# Patient Record
Sex: Female | Born: 1995 | Race: White | Hispanic: No | Smoking: Never smoker
Health system: Southern US, Community
[De-identification: ages and names within clinical notes are randomized; demographics above are authoritative.]

## PROBLEM LIST (undated history)

## (undated) DIAGNOSIS — O24419 Gestational diabetes mellitus in pregnancy, unspecified control: Secondary | ICD-10-CM

## (undated) DIAGNOSIS — Z789 Other specified health status: Secondary | ICD-10-CM

## (undated) DIAGNOSIS — O139 Gestational [pregnancy-induced] hypertension without significant proteinuria, unspecified trimester: Secondary | ICD-10-CM

## (undated) DIAGNOSIS — R112 Nausea with vomiting, unspecified: Secondary | ICD-10-CM

## (undated) DIAGNOSIS — K8020 Calculus of gallbladder without cholecystitis without obstruction: Secondary | ICD-10-CM

## (undated) DIAGNOSIS — Z8632 Personal history of gestational diabetes: Secondary | ICD-10-CM

## (undated) HISTORY — DX: Gestational (pregnancy-induced) hypertension without significant proteinuria, unspecified trimester: O13.9

## (undated) HISTORY — PX: NO PAST SURGERIES: SHX2092

## (undated) HISTORY — DX: Other specified health status: Z78.9

---
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
## 2014-03-24 ENCOUNTER — Encounter (HOSPITAL_COMMUNITY): Payer: Self-pay | Admitting: Emergency Medicine

## 2014-03-24 ENCOUNTER — Emergency Department (INDEPENDENT_AMBULATORY_CARE_PROVIDER_SITE_OTHER)
Admission: EM | Admit: 2014-03-24 | Discharge: 2014-03-24 | Disposition: A | Discharge status: Home / Self Care | Payer: Self-pay | Source: Home / Self Care | Attending: Family Medicine | Admitting: Family Medicine

## 2014-03-24 ENCOUNTER — Emergency Department (INDEPENDENT_AMBULATORY_CARE_PROVIDER_SITE_OTHER): Payer: Self-pay

## 2014-03-24 DIAGNOSIS — M25512 Pain in left shoulder: Secondary | ICD-10-CM

## 2014-03-24 MED ORDER — CYCLOBENZAPRINE HCL 5 MG PO TABS: 5.0000 mg | ORAL_TABLET | Freq: Every evening | ORAL | Status: DC | PRN

## 2014-03-24 MED ORDER — IBUPROFEN 800 MG PO TABS
ORAL_TABLET | ORAL | Status: AC
  Filled 2014-03-24: qty 1

## 2014-03-24 MED ORDER — DICLOFENAC SODIUM 75 MG PO TBEC: 75.0000 mg | DELAYED_RELEASE_TABLET | Freq: Two times a day (BID) | ORAL | Status: DC | PRN

## 2014-03-24 MED ORDER — IBUPROFEN 800 MG PO TABS
800.0000 mg | ORAL_TABLET | Freq: Once | ORAL | Status: AC
  Administered 2014-03-24: 800 mg via ORAL

## 2014-03-24 NOTE — Discharge Instructions (Signed)
Thank you for coming in today. Come back or go to the emergency room if you notice new weakness new numbness problems walking or bowel or bladder problems. Follow up with Dr. Katrinka Blazing if not better  Cervical Strain and Sprain (Whiplash) with Rehab Cervical strain and sprain are injuries that commonly occur with "whiplash" injuries. Whiplash occurs when the neck is forcefully whipped backward or forward, such as during a motor vehicle accident or during contact sports. The muscles, ligaments, tendons, discs, and nerves of the neck are susceptible to injury when this occurs. RISK FACTORS Risk of having a whiplash injury increases if:  Osteoarthritis of the spine.  Situations that make head or neck accidents or trauma more likely.  High-risk sports (football, rugby, wrestling, hockey, auto racing, gymnastics, diving, contact karate, or boxing).  Poor strength and flexibility of the neck.  Previous neck injury.  Poor tackling technique.  Improperly fitted or padded equipment. SYMPTOMS   Pain or stiffness in the front or back of neck or both.  Symptoms may present immediately or up to 24 hours after injury.  Dizziness, headache, nausea, and vomiting.  Muscle spasm with soreness and stiffness in the neck.  Tenderness and swelling at the injury site. PREVENTION  Learn and use proper technique (avoid tackling with the head, spearing, and head-butting; use proper falling techniques to avoid landing on the head).  Warm up and stretch properly before activity.  Maintain physical fitness:  Strength, flexibility, and endurance.  Cardiovascular fitness.  Wear properly fitted and padded protective equipment, such as padded soft collars, for participation in contact sports. PROGNOSIS  Recovery from cervical strain and sprain injuries is dependent on the extent of the injury. These injuries are usually curable in 1 week to 3 months with appropriate treatment.  RELATED COMPLICATIONS    Temporary numbness and weakness may occur if the nerve roots are damaged, and this may persist until the nerve has completely healed.  Chronic pain due to frequent recurrence of symptoms.  Prolonged healing, especially if activity is resumed too soon (before complete recovery). TREATMENT  Treatment initially involves the use of ice and medication to help reduce pain and inflammation. It is also important to perform strengthening and stretching exercises and modify activities that worsen symptoms so the injury does not get worse. These exercises may be performed at home or with a therapist. For patients who experience severe symptoms, a soft, padded collar may be recommended to be worn around the neck.  Improving your posture may help reduce symptoms. Posture improvement includes pulling your chin and abdomen in while sitting or standing. If you are sitting, sit in a firm chair with your buttocks against the back of the chair. While sleeping, try replacing your pillow with a small towel rolled to 2 inches in diameter, or use a cervical pillow or soft cervical collar. Poor sleeping positions delay healing.  For patients with nerve root damage, which causes numbness or weakness, the use of a cervical traction apparatus may be recommended. Surgery is rarely necessary for these injuries. However, cervical strain and sprains that are present at birth (congenital) may require surgery. MEDICATION   If pain medication is necessary, nonsteroidal anti-inflammatory medications, such as aspirin and ibuprofen, or other minor pain relievers, such as acetaminophen, are often recommended.  Do not take pain medication for 7 days before surgery.  Prescription pain relievers may be given if deemed necessary by your caregiver. Use only as directed and only as much as you need. HEAT AND  COLD:   Cold treatment (icing) relieves pain and reduces inflammation. Cold treatment should be applied for 10 to 15 minutes every  2 to 3 hours for inflammation and pain and immediately after any activity that aggravates your symptoms. Use ice packs or an ice massage.  Heat treatment may be used prior to performing the stretching and strengthening activities prescribed by your caregiver, physical therapist, or athletic trainer. Use a heat pack or a warm soak. SEEK MEDICAL CARE IF:   Symptoms get worse or do not improve in 2 weeks despite treatment.  New, unexplained symptoms develop (drugs used in treatment may produce side effects). EXERCISES RANGE OF MOTION (ROM) AND STRETCHING EXERCISES - Cervical Strain and Sprain These exercises may help you when beginning to rehabilitate your injury. In order to successfully resolve your symptoms, you must improve your posture. These exercises are designed to help reduce the forward-head and rounded-shoulder posture which contributes to this condition. Your symptoms may resolve with or without further involvement from your physician, physical therapist or athletic trainer. While completing these exercises, remember:   Restoring tissue flexibility helps normal motion to return to the joints. This allows healthier, less painful movement and activity.  An effective stretch should be held for at least 20 seconds, although you may need to begin with shorter hold times for comfort.  A stretch should never be painful. You should only feel a gentle lengthening or release in the stretched tissue. STRETCH- Axial Extensors  Lie on your back on the floor. You may bend your knees for comfort. Place a rolled-up hand towel or dish towel, about 2 inches in diameter, under the part of your head that makes contact with the floor.  Gently tuck your chin, as if trying to make a "double chin," until you feel a gentle stretch at the base of your head.  Hold __________ seconds. Repeat __________ times. Complete this exercise __________ times per day.  STRETCH - Axial Extension   Stand or sit on a firm  surface. Assume a good posture: chest up, shoulders drawn back, abdominal muscles slightly tense, knees unlocked (if standing) and feet hip width apart.  Slowly retract your chin so your head slides back and your chin slightly lowers. Continue to look straight ahead.  You should feel a gentle stretch in the back of your head. Be certain not to feel an aggressive stretch since this can cause headaches later.  Hold for __________ seconds. Repeat __________ times. Complete this exercise __________ times per day. STRETCH - Cervical Side Bend   Stand or sit on a firm surface. Assume a good posture: chest up, shoulders drawn back, abdominal muscles slightly tense, knees unlocked (if standing) and feet hip width apart.  Without letting your nose or shoulders move, slowly tip your right / left ear to your shoulder until your feel a gentle stretch in the muscles on the opposite side of your neck.  Hold __________ seconds. Repeat __________ times. Complete this exercise __________ times per day. STRETCH - Cervical Rotators   Stand or sit on a firm surface. Assume a good posture: chest up, shoulders drawn back, abdominal muscles slightly tense, knees unlocked (if standing) and feet hip width apart.  Keeping your eyes level with the ground, slowly turn your head until you feel a gentle stretch along the back and opposite side of your neck.  Hold __________ seconds. Repeat __________ times. Complete this exercise __________ times per day. RANGE OF MOTION - Neck Circles   Stand or  sit on a firm surface. Assume a good posture: chest up, shoulders drawn back, abdominal muscles slightly tense, knees unlocked (if standing) and feet hip width apart.  Gently roll your head down and around from the back of one shoulder to the back of the other. The motion should never be forced or painful.  Repeat the motion 10-20 times, or until you feel the neck muscles relax and loosen. Repeat __________ times. Complete  the exercise __________ times per day. STRENGTHENING EXERCISES - Cervical Strain and Sprain These exercises may help you when beginning to rehabilitate your injury. They may resolve your symptoms with or without further involvement from your physician, physical therapist, or athletic trainer. While completing these exercises, remember:   Muscles can gain both the endurance and the strength needed for everyday activities through controlled exercises.  Complete these exercises as instructed by your physician, physical therapist, or athletic trainer. Progress the resistance and repetitions only as guided.  You may experience muscle soreness or fatigue, but the pain or discomfort you are trying to eliminate should never worsen during these exercises. If this pain does worsen, stop and make certain you are following the directions exactly. If the pain is still present after adjustments, discontinue the exercise until you can discuss the trouble with your clinician. STRENGTH - Cervical Flexors, Isometric  Face a wall, standing about 6 inches away. Place a small pillow, a ball about 6-8 inches in diameter, or a folded towel between your forehead and the wall.  Slightly tuck your chin and gently push your forehead into the soft object. Push only with mild to moderate intensity, building up tension gradually. Keep your jaw and forehead relaxed.  Hold 10 to 20 seconds. Keep your breathing relaxed.  Release the tension slowly. Relax your neck muscles completely before you start the next repetition. Repeat __________ times. Complete this exercise __________ times per day. STRENGTH- Cervical Lateral Flexors, Isometric   Stand about 6 inches away from a wall. Place a small pillow, a ball about 6-8 inches in diameter, or a folded towel between the side of your head and the wall.  Slightly tuck your chin and gently tilt your head into the soft object. Push only with mild to moderate intensity, building up  tension gradually. Keep your jaw and forehead relaxed.  Hold 10 to 20 seconds. Keep your breathing relaxed.  Release the tension slowly. Relax your neck muscles completely before you start the next repetition. Repeat __________ times. Complete this exercise __________ times per day. STRENGTH - Cervical Extensors, Isometric   Stand about 6 inches away from a wall. Place a small pillow, a ball about 6-8 inches in diameter, or a folded towel between the back of your head and the wall.  Slightly tuck your chin and gently tilt your head back into the soft object. Push only with mild to moderate intensity, building up tension gradually. Keep your jaw and forehead relaxed.  Hold 10 to 20 seconds. Keep your breathing relaxed.  Release the tension slowly. Relax your neck muscles completely before you start the next repetition. Repeat __________ times. Complete this exercise __________ times per day. POSTURE AND BODY MECHANICS CONSIDERATIONS - Cervical Strain and Sprain Keeping correct posture when sitting, standing or completing your activities will reduce the stress put on different body tissues, allowing injured tissues a chance to heal and limiting painful experiences. The following are general guidelines for improved posture. Your physician or physical therapist will provide you with any instructions specific to your  needs. While reading these guidelines, remember:  The exercises prescribed by your provider will help you have the flexibility and strength to maintain correct postures.  The correct posture provides the optimal environment for your joints to work. All of your joints have less wear and tear when properly supported by a spine with good posture. This means you will experience a healthier, less painful body.  Correct posture must be practiced with all of your activities, especially prolonged sitting and standing. Correct posture is as important when doing repetitive low-stress activities  (typing) as it is when doing a single heavy-load activity (lifting). PROLONGED STANDING WHILE SLIGHTLY LEANING FORWARD When completing a task that requires you to lean forward while standing in one place for a long time, place either foot up on a stationary 2- to 4-inch high object to help maintain the best posture. When both feet are on the ground, the low back tends to lose its slight inward curve. If this curve flattens (or becomes too large), then the back and your other joints will experience too much stress, fatigue more quickly, and can cause pain.  RESTING POSITIONS Consider which positions are most painful for you when choosing a resting position. If you have pain with flexion-based activities (sitting, bending, stooping, squatting), choose a position that allows you to rest in a less flexed posture. You would want to avoid curling into a fetal position on your side. If your pain worsens with extension-based activities (prolonged standing, working overhead), avoid resting in an extended position such as sleeping on your stomach. Most people will find more comfort when they rest with their spine in a more neutral position, neither too rounded nor too arched. Lying on a non-sagging bed on your side with a pillow between your knees, or on your back with a pillow under your knees will often provide some relief. Keep in mind, being in any one position for a prolonged period of time, no matter how correct your posture, can still lead to stiffness. WALKING Walk with an upright posture. Your ears, shoulders, and hips should all line up. OFFICE WORK When working at a desk, create an environment that supports good, upright posture. Without extra support, muscles fatigue and lead to excessive strain on joints and other tissues. CHAIR:  A chair should be able to slide under your desk when your back makes contact with the back of the chair. This allows you to work closely.  The chair's height should allow  your eyes to be level with the upper part of your monitor and your hands to be slightly lower than your elbows.  Body position:  Your feet should make contact with the floor. If this is not possible, use a foot rest.  Keep your ears over your shoulders. This will reduce stress on your neck and low back. Document Released: 01/14/2005 Document Revised: 05/31/2013 Document Reviewed: 04/28/2008 [facility] Patient Information 2015 [employer] [place]. This information is not intended to replace advice given to you by your health care provider. Make sure you discuss any questions you have with your health care provider.
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
## 2015-11-29 ENCOUNTER — Emergency Department (HOSPITAL_COMMUNITY)
Admission: EM | Admit: 2015-11-29 | Discharge: 2015-11-29 | Disposition: A | Discharge status: Home / Self Care | Payer: Managed Care, Other (non HMO) | Attending: Emergency Medicine | Admitting: Emergency Medicine

## 2015-11-29 ENCOUNTER — Encounter (HOSPITAL_COMMUNITY): Payer: Self-pay | Admitting: Emergency Medicine

## 2015-11-29 ENCOUNTER — Emergency Department (HOSPITAL_COMMUNITY): Payer: Managed Care, Other (non HMO)

## 2015-11-29 DIAGNOSIS — R1011 Right upper quadrant pain: Secondary | ICD-10-CM | POA: Diagnosis not present

## 2015-11-29 LAB — URINALYSIS, ROUTINE W REFLEX MICROSCOPIC
Bilirubin Urine: NEGATIVE
Glucose, UA: NEGATIVE mg/dL
Hgb urine dipstick: NEGATIVE
Ketones, ur: NEGATIVE mg/dL
LEUKOCYTES UA: NEGATIVE
Nitrite: NEGATIVE
PROTEIN: NEGATIVE mg/dL
Specific Gravity, Urine: 1.022 (ref 1.005–1.030)
pH: 8.5 — ABNORMAL HIGH (ref 5.0–8.0)

## 2015-11-29 LAB — CBC
HEMATOCRIT: 42 % (ref 36.0–46.0)
HEMOGLOBIN: 14.4 g/dL (ref 12.0–15.0)
MCH: 30.7 pg (ref 26.0–34.0)
MCHC: 34.3 g/dL (ref 30.0–36.0)
MCV: 89.6 fL (ref 78.0–100.0)
PLATELETS: 356 10*3/uL (ref 150–400)
RBC: 4.69 MIL/uL (ref 3.87–5.11)
RDW: 11.9 % (ref 11.5–15.5)
WBC: 10 10*3/uL (ref 4.0–10.5)

## 2015-11-29 LAB — COMPREHENSIVE METABOLIC PANEL
ALBUMIN: 4.9 g/dL (ref 3.5–5.0)
ALT: 16 U/L (ref 14–54)
ANION GAP: 8 (ref 5–15)
AST: 17 U/L (ref 15–41)
Alkaline Phosphatase: 114 U/L (ref 38–126)
BUN: 9 mg/dL (ref 6–20)
CO2: 28 mmol/L (ref 22–32)
Calcium: 10.5 mg/dL — ABNORMAL HIGH (ref 8.9–10.3)
Chloride: 102 mmol/L (ref 101–111)
Creatinine, Ser: 0.65 mg/dL (ref 0.44–1.00)
GFR calc Af Amer: >60 mL/min (ref >60)
GFR calc non Af Amer: >60 mL/min (ref >60)
GLUCOSE: 99 mg/dL (ref 65–99)
POTASSIUM: 3.7 mmol/L (ref 3.5–5.1)
SODIUM: 138 mmol/L (ref 135–145)
TOTAL PROTEIN: 8.5 g/dL — AB (ref 6.5–8.1)
Total Bilirubin: 0.7 mg/dL (ref 0.3–1.2)

## 2015-11-29 LAB — LIPASE, BLOOD: Lipase: 19 U/L (ref 11–51)

## 2015-11-29 LAB — PREGNANCY, URINE: PREG TEST UR: NEGATIVE

## 2015-11-29 NOTE — ED Triage Notes (Signed)
Per pt, states RUQ pain for 2 days-states nausea no vomiting-was seen by PCP and sent here for further eval

## 2015-11-29 NOTE — Discharge Instructions (Signed)
There does not appear to be an emergent cause for your symptoms at this time. Your labs and ultrasound were very reassuring. Please follow-up with your doctor for reevaluation. Return to ED for new or worsening symptoms as we discussed.

## 2015-11-29 NOTE — ED Provider Notes (Signed)
[facility]-EMERGENCY DEPT Provider Note   CSN: [id number] Arrival date & time: 11/29/15  1627     History   Chief Complaint Chief Complaint  Patient presents with  . Abdominal Pain    HPI Wanda Craig is a 20 y.o. female.  HPI here for evaluation of sudden onset epigastric and right upper quadrant sharp pain. Patient reports symptoms started yesterday at 11:30 AM. Not relieved by Tums. No dietary component. Pain will radiate through to her back when she breathes deeply. She reports associated nausea but no vomiting, urinary symptoms. Last menstrual cycle 2 weeks ago and normal for her. No other unusual vaginal bleeding or discharge. [facility]  History reviewed. No pertinent past medical history.  There are no active problems to display for this patient.   History reviewed. No pertinent surgical history.  OB History    No data available       Home Medications    Prior to Admission medications   Not on File    Family History No family history on file.  Social History Social History  Substance Use Topics  . Smoking status: Never Smoker  . Smokeless tobacco: Not on file  . Alcohol use No     Allergies   Other   Review of Systems Review of Systems A 10 point review of systems was completed and was negative except for pertinent positives and negatives as mentioned in the history of present illness    Physical Exam Updated Vital Signs BP 141/71   Pulse 95   Temp 98.3 F (36.8 C) (Oral)   Resp 18   Wt 79.9 kg   LMP 11/15/2015   SpO2 100%   Physical Exam  Constitutional: She appears well-developed. No distress.  Awake, alert and nontoxic in appearance  HENT:  Head: Normocephalic and atraumatic.  Right Ear: External ear normal.  Left Ear: External ear normal.  Mouth/Throat: Oropharynx is clear and moist.  Eyes: Conjunctivae and EOM are normal. Pupils are equal, round, and reactive to light.  Neck: Normal range of  motion. No JVD present.  Cardiovascular: Normal rate, regular rhythm and normal heart sounds.   Pulmonary/Chest: Effort normal and breath sounds normal. No stridor.  Abdominal: Soft.  Discomfort is replicated with palpation in epigastrium and right upper quadrant, worse in right upper quadrant region. Abdomen is otherwise soft, nondistended, no rebound or guarding. No right lower quadrant pain. No rash or other skin abnormality  Musculoskeletal: Normal range of motion.  Neurological:  Awake, alert, cooperative and aware of situation; motor strength and sensation appear baseline no facial asymmetry; tongue midline; major cranial nerves appear intact;  baseline gait without new ataxia.  Skin: No rash noted. She is not diaphoretic.  Psychiatric: She has a normal mood and affect. Her behavior is normal. Thought content normal.  Nursing note and vitals reviewed.    ED Treatments / Results  Labs (all labs ordered are listed, but only abnormal results are displayed) Labs Reviewed  COMPREHENSIVE METABOLIC PANEL - Abnormal; Notable for the following:       Result Value   Calcium 10.5 (*)    Total Protein 8.5 (*)    All other components within normal limits  URINALYSIS, ROUTINE W REFLEX MICROSCOPIC (NOT [facility]) - Abnormal; Notable for the following:    APPearance CLOUDY (*)    pH 8.5 (*)    All other components within normal limits  LIPASE, BLOOD  CBC  PREGNANCY, URINE  EKG  EKG Interpretation None       Radiology [place] Abdomen Complete  Result Date: 11/29/2015 CLINICAL DATA:  Acute onset of right upper quadrant abdominal pain. Initial encounter. EXAM: ABDOMEN ULTRASOUND COMPLETE COMPARISON:  None. FINDINGS: Gallbladder: No gallstones or wall thickening visualized. No sonographic Murphy sign noted by sonographer. Common bile duct: Diameter: 0.2 cm, within normal limits in caliber. Liver: No focal lesion identified. Within normal limits in parenchymal echogenicity. IVC: No abnormality  visualized. Pancreas: Visualized portion unremarkable. Spleen: Size and appearance within normal limits. Right Kidney: Length: 10.3 cm. Echogenicity within normal limits. No mass or hydronephrosis visualized. Left Kidney: Length: 10.7 cm. Echogenicity within normal limits. No mass or hydronephrosis visualized. Abdominal aorta: No aneurysm visualized. The common iliac arteries are not well characterized due to overlying structures. Other findings: None. IMPRESSION: Unremarkable abdominal ultrasound. Electronically Signed   By: Roanna RaiderJeffery  Chang M.D.   On: 11/29/2015 19:18    Procedures Procedures (including critical care time)  Medications Ordered in ED Medications - No data to display   Initial Impression / Assessment and Plan / ED Course  I have reviewed the triage vital signs and the nursing notes.  Pertinent labs & imaging results that were available during my care of the patient were reviewed by me and considered in my medical decision making (see chart for details).  Clinical Course    Patient sent by urgent care facility to ED for valuation right upper quadrant pain. She does have Mild to moderate tenderness in her right upper quadrant on exam. She is otherwise afebrile, hemodynamically stable. We will obtain screening labs, right upper quadrant ultrasound. Labs are reassuring abdominal ultrasound is unremarkable. She overall appears very well, hemodynamically stable. Discussed follow-up with PCP. Strict return precautions discussed. Patient and mother bedside verbalize understanding and agreement with plan and subsequent discharge.  Final Clinical Impressions(s) / ED Diagnoses   Final diagnoses:  RUQ pain    New Prescriptions New Prescriptions   No medications on file     Joycie PeekBenjamin Micco Bourbeau, PA-C 11/29/15 2001    Rolland PorterMark James, MD 12/09/15 [phone]

## 2015-11-29 NOTE — ED Notes (Signed)
Coming from PCP for further evaluation r/t RUQ pain-

## 2015-11-29 NOTE — ED Notes (Signed)
Pt is aware the urine specimen is needed.

## 2015-11-29 NOTE — Progress Notes (Signed)
Patient listed as not having a pcp or insurance.  EDCM spoke to patient at bedside.  Patient reports she has insurance, no pcp.  Patient has not been registered yet.  Patient is agreeable to receive a list of providers who accept her insurance to assist in establishing care.

## 2017-01-11 ENCOUNTER — Encounter (HOSPITAL_COMMUNITY): Payer: Self-pay | Admitting: Emergency Medicine

## 2017-01-11 ENCOUNTER — Ambulatory Visit (INDEPENDENT_AMBULATORY_CARE_PROVIDER_SITE_OTHER): Payer: 59

## 2017-01-11 ENCOUNTER — Ambulatory Visit (HOSPITAL_COMMUNITY)
Admission: EM | Admit: 2017-01-11 | Discharge: 2017-01-11 | Disposition: A | Discharge status: Home / Self Care | Payer: 59 | Attending: Family Medicine | Admitting: Family Medicine

## 2017-01-11 DIAGNOSIS — S8002XA Contusion of left knee, initial encounter: Secondary | ICD-10-CM | POA: Diagnosis not present

## 2017-01-11 DIAGNOSIS — M25562 Pain in left knee: Secondary | ICD-10-CM

## 2017-01-11 DIAGNOSIS — W19XXXA Unspecified fall, initial encounter: Secondary | ICD-10-CM

## 2017-01-11 NOTE — Discharge Instructions (Signed)
You may use over the counter ibuprofen or acetaminophen as needed.  ° °

## 2017-01-11 NOTE — ED Triage Notes (Signed)
PT C/O: reports she slipped on wet floor at a restaurant last night and inj her left knee  ONSET: last night  SX ALSO INCLUDE: pain increases w/activity   DENIES: head /LOC  TAKING MEDS: Ibup  A&O x4... NAD... Ambulatory

## 2017-01-13 NOTE — ED Provider Notes (Signed)
  [facility]   [id number] 01/11/17 Arrival Time: 1836  ASSESSMENT & PLAN:  1. Contusion of left knee, initial encounter    Prefers OTC analgesics. ICE. No abnormalities seen on imaging this evening. May f/u here if not seeing improvement over the next week.  Reviewed expectations re: course of current medical issues. Questions answered. Outlined signs and symptoms indicating need for more acute intervention. Patient verbalized understanding. After Visit Summary given.   SUBJECTIVE:  Wanda Craig is a 21 y.o. female who reports pain of her L knee after falling at a restaurant last evening. Reports her L knee made contact with the floor producing immediate discomfort. Able to bear weight since but bending knee increases discomfort. Describes as an "ache" with sharp exacerbations; poorly localized. No extremity sensation changes or weakness. Ibuprofen helps. No history of knee injury/pain reported. Knee not locking up or giving out on her.  ROS: As per HPI.   OBJECTIVE: BP 114/60 (BP Location: Left Arm)   Pulse 70   Temp 98.7 F (37.1 C) (Oral)   Resp 18   LMP 12/22/2016   SpO2 100%   General appearance: alert; no distress Extremities: no cyanosis or edema; symmetrical with no gross deformities; poorly localized tenderness over her left knee with no swelling and no bruising; FROM with discomfort; no crepitus CV: normal extremity capillary refill Skin: warm and dry Neurologic: normal gait but favors LLE; normal symmetric reflexes in all extremities; normal sensation Psychological: alert and cooperative; normal mood and affect  Imaging: Dg Knee Complete 4 Views Left  Result Date: 01/11/2017 CLINICAL DATA:  Left knee pain after fall last night. EXAM: LEFT KNEE - COMPLETE 4+ VIEW COMPARISON:  None. FINDINGS: No evidence of fracture, dislocation, or joint effusion. No evidence of arthropathy or other focal bone abnormality. Soft tissues are unremarkable. IMPRESSION:  Negative radiographs of the left knee. Electronically Signed   By: Rubye OaksMelanie  Ehinger M.D.   On: 01/11/2017 19:42    Allergies  Allergen Reactions  . Other Other (See Comments)    Pt requests no narcotic orders.  States she has no substance issues; she states she prefers to be cognizant of whether or not she is getting better and does not want her pain to be "muted."      Social History   Socioeconomic History  . Marital status: Single    Spouse name: Not on file  . Number of children: Not on file  . Years of education: Not on file  . Highest education level: Not on file  Social Needs  . Financial resource strain: Not on file  . Food insecurity - worry: Not on file  . Food insecurity - inability: Not on file  . Transportation needs - medical: Not on file  . Transportation needs - non-medical: Not on file  Occupational History  . Not on file  Tobacco Use  . Smoking status: Never Smoker  . Smokeless tobacco: Never Used  Substance and Sexual Activity  . Alcohol use: No  . Drug use: No  . Sexual activity: Yes  Other Topics Concern  . Not on file  Social History Narrative  . Not on file      Mardella LaymanHagler, Coran Dipaola, MD 01/13/17 1440

## 2018-07-06 ENCOUNTER — Ambulatory Visit (HOSPITAL_COMMUNITY): Payer: 59

## 2018-07-06 ENCOUNTER — Ambulatory Visit (HOSPITAL_COMMUNITY)
Admission: RE | Admit: 2018-07-06 | Discharge: 2018-07-06 | Disposition: A | Discharge status: Home / Self Care | Payer: 59 | Source: Ambulatory Visit | Attending: Emergency Medicine | Admitting: Emergency Medicine

## 2018-07-06 ENCOUNTER — Other Ambulatory Visit: Payer: Self-pay | Admitting: Emergency Medicine

## 2018-07-06 ENCOUNTER — Other Ambulatory Visit (HOSPITAL_COMMUNITY): Payer: Self-pay | Admitting: Emergency Medicine

## 2018-07-06 ENCOUNTER — Other Ambulatory Visit: Payer: Self-pay

## 2018-07-06 DIAGNOSIS — J3489 Other specified disorders of nose and nasal sinuses: Secondary | ICD-10-CM | POA: Diagnosis present

## 2018-07-06 DIAGNOSIS — R413 Other amnesia: Secondary | ICD-10-CM | POA: Insufficient documentation

## 2018-07-06 DIAGNOSIS — S0083XA Contusion of other part of head, initial encounter: Secondary | ICD-10-CM | POA: Insufficient documentation

## 2018-07-06 MED ORDER — IOHEXOL 300 MG/ML  SOLN
100.0000 mL | Freq: Once | INTRAMUSCULAR | Status: AC | PRN
  Administered 2018-07-06: 100 mL via INTRAVENOUS

## 2020-08-29 ENCOUNTER — Encounter: Payer: Self-pay | Admitting: Adult Health

## 2020-08-29 ENCOUNTER — Other Ambulatory Visit: Payer: Self-pay

## 2020-08-29 ENCOUNTER — Ambulatory Visit (INDEPENDENT_AMBULATORY_CARE_PROVIDER_SITE_OTHER): Payer: Medicaid Other | Admitting: Adult Health

## 2020-08-29 VITALS — BP 122/75 | HR 73 | Ht 63.0 in | Wt 204.0 lb

## 2020-08-29 DIAGNOSIS — Z3201 Encounter for pregnancy test, result positive: Secondary | ICD-10-CM

## 2020-08-29 DIAGNOSIS — O3680X0 Pregnancy with inconclusive fetal viability, not applicable or unspecified: Secondary | ICD-10-CM

## 2020-08-29 DIAGNOSIS — Z3A01 Less than 8 weeks gestation of pregnancy: Secondary | ICD-10-CM | POA: Diagnosis not present

## 2020-08-29 LAB — POCT URINE PREGNANCY: Preg Test, Ur: POSITIVE — AB

## 2020-08-29 NOTE — Patient Instructions (Signed)
Thank you for choosing our office today! We appreciate the opportunity to meet your healthcare needs. You may receive a short survey by e-mail or through MyChart. If you are happy with your care we would appreciate if you could take just a few minutes to complete the survey questions. We read all of your comments and take your feedback very seriously. Thank you again for choosing our office.  Noralyn Karim   [facility]  

## 2020-08-29 NOTE — Progress Notes (Signed)
  Subjective:     Patient ID: Wanda Craig, female   DOB: 05-23-1995, 25 y.o.   MRN: [id number]  HPI Oliwia is a 25 year old white female,single, in for UPT, has missed periods, was seen at [facility] has IUP,taking OTC PNV. She is a Producer, television/film/video.  Review of Systems +missed periods Maybe some nausea Reviewed past medical,surgical, social and family history. Reviewed medications and allergies.     Objective:   Physical Exam BP 122/75 (BP Location: Right Arm, Patient Position: Sitting, Cuff Size: Normal)   Pulse 73   Ht 5\' 3"  (1.6 m)   Wt 204 lb (92.5 kg)   LMP  (LMP Unknown)   BMI 36.14 kg/m  UPT + Skin warm and dry. Neck: mid line trachea, normal thyroid, good ROM, no lymphadenopathy noted. Lungs: clear to ausculation bilaterally. Cardiovascular: regular rate and rhythm. Abdomen is soft and non tender. AA is 0  Fall risk is low Depression screen PHQ 2/9 08/29/2020  Decreased Interest 0  Down, Depressed, Hopeless 0  PHQ - 2 Score 0        [facility] - 08/29/20 1510       Pregnancy Intention Screening   Does the patient want to become pregnant in the next year? Yes    Does the patient's partner want to become pregnant in the next year? Yes    Would the patient like to discuss contraceptive options today? No      Contraception Wrap Up   Current Method Pregnant/Seeking Pregnancy    End Method Pregnant/Seeking Pregnancy    Contraception Counseling Provided No             Assessment:     1. Pregnancy test positive Continue PNV  2. Less than [redacted] weeks gestation of pregnancy Check QHCG to determine weeks of gestation   3. Encounter to determine fetal viability of pregnancy, single or unspecified fetus Return in about 3 weeks for dating 10/29/20    Plan:     Review handout on first trimester and by [facility]

## 2020-08-30 LAB — BETA HCG QUANT (REF LAB): hCG Quant: 59311 m[IU]/mL
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
## 2020-09-15 ENCOUNTER — Other Ambulatory Visit: Payer: Self-pay | Admitting: Women's Health

## 2020-09-15 MED ORDER — PROMETHAZINE HCL 25 MG PO TABS: 12.5000 mg | ORAL_TABLET | Freq: Four times a day (QID) | ORAL | 0 refills | Status: DC | PRN

## 2020-09-20 ENCOUNTER — Ambulatory Visit (INDEPENDENT_AMBULATORY_CARE_PROVIDER_SITE_OTHER): Payer: Medicaid Other

## 2020-09-20 ENCOUNTER — Other Ambulatory Visit: Payer: Self-pay

## 2020-09-20 DIAGNOSIS — Z3A10 10 weeks gestation of pregnancy: Secondary | ICD-10-CM | POA: Diagnosis not present

## 2020-09-20 DIAGNOSIS — O3680X0 Pregnancy with inconclusive fetal viability, not applicable or unspecified: Secondary | ICD-10-CM

## 2020-09-20 NOTE — Progress Notes (Signed)
[place] 10+3 wks,single IUP,CRL 35.31 mm,fhr 169 bpm,normal ovaries

## 2020-10-03 ENCOUNTER — Other Ambulatory Visit: Payer: Self-pay | Admitting: Obstetrics & Gynecology

## 2020-10-03 DIAGNOSIS — O0990 Supervision of high risk pregnancy, unspecified, unspecified trimester: Secondary | ICD-10-CM | POA: Insufficient documentation

## 2020-10-03 DIAGNOSIS — Z3682 Encounter for antenatal screening for nuchal translucency: Secondary | ICD-10-CM

## 2020-10-03 DIAGNOSIS — Z3400 Encounter for supervision of normal first pregnancy, unspecified trimester: Secondary | ICD-10-CM | POA: Insufficient documentation

## 2020-10-04 ENCOUNTER — Encounter: Payer: Self-pay | Admitting: Advanced Practice Midwife

## 2020-10-04 ENCOUNTER — Other Ambulatory Visit: Payer: Self-pay

## 2020-10-04 ENCOUNTER — Ambulatory Visit (INDEPENDENT_AMBULATORY_CARE_PROVIDER_SITE_OTHER): Payer: Medicaid Other

## 2020-10-04 ENCOUNTER — Ambulatory Visit: Payer: Medicaid Other | Admitting: *Deleted

## 2020-10-04 ENCOUNTER — Ambulatory Visit (INDEPENDENT_AMBULATORY_CARE_PROVIDER_SITE_OTHER): Payer: Medicaid Other | Admitting: Advanced Practice Midwife

## 2020-10-04 VITALS — BP 125/75 | HR 91 | Wt 206.0 lb

## 2020-10-04 DIAGNOSIS — Z124 Encounter for screening for malignant neoplasm of cervix: Secondary | ICD-10-CM

## 2020-10-04 DIAGNOSIS — Z113 Encounter for screening for infections with a predominantly sexual mode of transmission: Secondary | ICD-10-CM

## 2020-10-04 DIAGNOSIS — Z3682 Encounter for antenatal screening for nuchal translucency: Secondary | ICD-10-CM

## 2020-10-04 DIAGNOSIS — Z3401 Encounter for supervision of normal first pregnancy, first trimester: Secondary | ICD-10-CM | POA: Diagnosis not present

## 2020-10-04 DIAGNOSIS — Z3A12 12 weeks gestation of pregnancy: Secondary | ICD-10-CM

## 2020-10-04 DIAGNOSIS — Z3402 Encounter for supervision of normal first pregnancy, second trimester: Secondary | ICD-10-CM

## 2020-10-04 LAB — POCT URINALYSIS DIPSTICK OB
Blood, UA: NEGATIVE
Glucose, UA: NEGATIVE
Leukocytes, UA: NEGATIVE
Nitrite, UA: NEGATIVE
POC,PROTEIN,UA: NEGATIVE

## 2020-10-04 MED ORDER — ASPIRIN 81 MG PO CHEW: 162.0000 mg | CHEWABLE_TABLET | Freq: Every day | ORAL | 7 refills | Status: DC

## 2020-10-04 MED ORDER — BLOOD PRESSURE MONITOR MISC: 0 refills | Status: DC

## 2020-10-04 NOTE — Patient Instructions (Signed)
Elsey, thank you for choosing our office today! We appreciate the opportunity to meet your healthcare needs. You may receive a short survey by mail, e-mail, or through MyChart. If you are happy with your care we would appreciate if you could take just a few minutes to complete the survey questions. We read all of your comments and take your feedback very seriously. Thank you again for choosing our office.  [facility]  [facility] ([address] [place], [place] [address]) Entrance C, located off of [address] Free 24/7 valet parking   Nausea & Vomiting Have saltine crackers or pretzels by your bed and eat a few bites before you raise your head out of bed in the morning Eat small frequent meals throughout the day instead of large meals Drink plenty of fluids throughout the day to stay hydrated, just don't drink a lot of fluids with your meals.  This can make your stomach fill up faster making you feel sick Do not brush your teeth right after you eat Products with real ginger are good for nausea, like ginger ale and ginger hard candy Make sure it says made with real ginger! Sucking on sour candy like lemon heads is also good for nausea If your prenatal vitamins make you nauseated, take them at night so you will sleep through the nausea Sea Bands If you feel like you need medicine for the nausea & vomiting please let us know If you are unable to keep any fluids or food down please let us know   Constipation Drink plenty of fluid, preferably water, throughout the day Eat foods high in fiber such as fruits, vegetables, and grains Exercise, such as walking, is a good way to keep your bowels regular Drink warm fluids, especially warm prune juice, or decaf coffee Eat a 1/2 cup of real oatmeal (not instant), 1/2 cup applesauce, and 1/2-1 cup warm prune juice every day If needed, you may take Colace (docusate sodium) stool softener  once or twice a day to help keep the stool soft.  If you still are having problems with constipation, you may take Miralax once daily as needed to help keep your bowels regular.   Home Blood Pressure Monitoring for Patients   Your provider has recommended that you check your blood pressure (BP) at least once a week at home. If you do not have a blood pressure cuff at home, one will be provided for you. Contact your provider if you have not received your monitor within 1 week.   Helpful Tips for Accurate Home Blood Pressure Checks  Don't smoke, exercise, or drink caffeine 30 minutes before checking your BP Use the restroom before checking your BP (a full bladder can raise your pressure) Relax in a comfortable upright chair Feet on the ground Left arm resting comfortably on a flat surface at the level of your heart Legs uncrossed Back supported Sit quietly and don't talk Place the cuff on your bare arm Adjust snuggly, so that only two fingertips can fit between your skin and the top of the cuff Check 2 readings separated by at least one minute Keep a log of your BP readings For a visual, please reference this diagram: http://ccnc.care/bpdiagram  Provider Name: [facility]     Phone: [phone]  Zone 1: ALL CLEAR  Continue to monitor your symptoms:  BP reading is less than 140 (top number) or less than 90 (bottom   number)  No right upper stomach pain No headaches or seeing spots No feeling nauseated or throwing up No swelling in face and hands  Zone 2: CAUTION Call your doctor's office for any of the following:  BP reading is greater than 140 (top number) or greater than 90 (bottom number)  Stomach pain under your ribs in the middle or right side Headaches or seeing spots Feeling nauseated or throwing up Swelling in face and hands  Zone 3: EMERGENCY  Seek immediate medical care if you have any of the following:  BP reading is greater than160 (top number) or greater than  110 (bottom number) Severe headaches not improving with Tylenol Serious difficulty catching your breath Any worsening symptoms from Zone 2    First Trimester of Pregnancy The first trimester of pregnancy is from week 1 until the end of week 12 (months 1 through 3). A week after a sperm fertilizes an egg, the egg will implant on the wall of the uterus. This embryo will begin to develop into a baby. Genes from you and your partner are forming the baby. The female genes determine whether the baby is a boy or a girl. At 6-8 weeks, the eyes and face are formed, and the heartbeat can be seen on ultrasound. At the end of 12 weeks, all the baby's organs are formed.  Now that you are pregnant, you will want to do everything you can to have a healthy baby. Two of the most important things are to get good prenatal care and to follow your health care provider's instructions. Prenatal care is all the medical care you receive before the baby's birth. This care will help prevent, find, and treat any problems during the pregnancy and childbirth. BODY CHANGES Your body goes through many changes during pregnancy. The changes vary from woman to woman.  You may gain or lose a couple of pounds at first. You may feel sick to your stomach (nauseous) and throw up (vomit). If the vomiting is uncontrollable, call your health care provider. You may tire easily. You may develop headaches that can be relieved by medicines approved by your health care provider. You may urinate more often. Painful urination may mean you have a bladder infection. You may develop heartburn as a result of your pregnancy. You may develop constipation because certain hormones are causing the muscles that push waste through your intestines to slow down. You may develop hemorrhoids or swollen, bulging veins (varicose veins). Your breasts may begin to grow larger and become tender. Your nipples may stick out more, and the tissue that surrounds them  (areola) may become darker. Your gums may bleed and may be sensitive to brushing and flossing. Dark spots or blotches (chloasma, mask of pregnancy) may develop on your face. This will likely fade after the baby is born. Your menstrual periods will stop. You may have a loss of appetite. You may develop cravings for certain kinds of food. You may have changes in your emotions from day to day, such as being excited to be pregnant or being concerned that something may go wrong with the pregnancy and baby. You may have more vivid and strange dreams. You may have changes in your hair. These can include thickening of your hair, rapid growth, and changes in texture. Some women also have hair loss during or after pregnancy, or hair that feels dry or thin. Your hair will most likely return to normal after your baby is born. WHAT TO EXPECT AT YOUR PRENATAL   VISITS During a routine prenatal visit: You will be weighed to make sure you and the baby are growing normally. Your blood pressure will be taken. Your abdomen will be measured to track your baby's growth. The fetal heartbeat will be listened to starting around week 10 or 12 of your pregnancy. Test results from any previous visits will be discussed. Your health care provider may ask you: How you are feeling. If you are feeling the baby move. If you have had any abnormal symptoms, such as leaking fluid, bleeding, severe headaches, or abdominal cramping. If you have any questions. Other tests that may be performed during your first trimester include: Blood tests to find your blood type and to check for the presence of any previous infections. They will also be used to check for low iron levels (anemia) and Rh antibodies. Later in the pregnancy, blood tests for diabetes will be done along with other tests if problems develop. Urine tests to check for infections, diabetes, or protein in the urine. An ultrasound to confirm the proper growth and development  of the baby. An amniocentesis to check for possible genetic problems. Fetal screens for spina bifida and Down syndrome. You may need other tests to make sure you and the baby are doing well. HOME CARE INSTRUCTIONS  Medicines Follow your health care provider's instructions regarding medicine use. Specific medicines may be either safe or unsafe to take during pregnancy. Take your prenatal vitamins as directed. If you develop constipation, try taking a stool softener if your health care provider approves. Diet Eat regular, well-balanced meals. Choose a variety of foods, such as meat or vegetable-based protein, fish, milk and low-fat dairy products, vegetables, fruits, and whole grain breads and cereals. Your health care provider will help you determine the amount of weight gain that is right for you. Avoid raw meat and uncooked cheese. These carry germs that can cause birth defects in the baby. Eating four or five small meals rather than three large meals a day may help relieve nausea and vomiting. If you start to feel nauseous, eating a few soda crackers can be helpful. Drinking liquids between meals instead of during meals also seems to help nausea and vomiting. If you develop constipation, eat more high-fiber foods, such as fresh vegetables or fruit and whole grains. Drink enough fluids to keep your urine clear or pale yellow. Activity and Exercise Exercise only as directed by your health care provider. Exercising will help you: Control your weight. Stay in shape. Be prepared for labor and delivery. Experiencing pain or cramping in the lower abdomen or low back is a good sign that you should stop exercising. Check with your health care provider before continuing normal exercises. Try to avoid standing for long periods of time. Move your legs often if you must stand in one place for a long time. Avoid heavy lifting. Wear low-heeled shoes, and practice good posture. You may continue to have sex  unless your health care provider directs you otherwise. Relief of Pain or Discomfort Wear a good support bra for breast tenderness.   Take warm sitz baths to soothe any pain or discomfort caused by hemorrhoids. Use hemorrhoid cream if your health care provider approves.   Rest with your legs elevated if you have leg cramps or low back pain. If you develop varicose veins in your legs, wear support hose. Elevate your feet for 15 minutes, 3-4 times a day. Limit salt in your diet. Prenatal Care Schedule your prenatal visits by the   twelfth week of pregnancy. They are usually scheduled monthly at first, then more often in the last 2 months before delivery. Write down your questions. Take them to your prenatal visits. Keep all your prenatal visits as directed by your health care provider. Safety Wear your seat belt at all times when driving. Make a list of emergency phone numbers, including numbers for family, friends, the hospital, and police and fire departments. General Tips Ask your health care provider for a referral to a local prenatal education class. Begin classes no later than at the beginning of month 6 of your pregnancy. Ask for help if you have counseling or nutritional needs during pregnancy. Your health care provider can offer advice or refer you to specialists for help with various needs. Do not use hot tubs, steam rooms, or saunas. Do not douche or use tampons or scented sanitary pads. Do not cross your legs for long periods of time. Avoid cat litter boxes and soil used by cats. These carry germs that can cause birth defects in the baby and possibly loss of the fetus by miscarriage or stillbirth. Avoid all smoking, herbs, alcohol, and medicines not prescribed by your health care provider. Chemicals in these affect the formation and growth of the baby. Schedule a dentist appointment. At home, brush your teeth with a soft toothbrush and be gentle when you floss. SEEK MEDICAL CARE IF:   You have dizziness. You have mild pelvic cramps, pelvic pressure, or nagging pain in the abdominal area. You have persistent nausea, vomiting, or diarrhea. You have a bad smelling vaginal discharge. You have pain with urination. You notice increased swelling in your face, hands, legs, or ankles. SEEK IMMEDIATE MEDICAL CARE IF:  You have a fever. You are leaking fluid from your vagina. You have spotting or bleeding from your vagina. You have severe abdominal cramping or pain. You have rapid weight gain or loss. You vomit blood or material that looks like coffee grounds. You are exposed to German measles and have never had them. You are exposed to fifth disease or chickenpox. You develop a severe headache. You have shortness of breath. You have any kind of trauma, such as from a fall or a car accident. Document Released: 01/08/2001 Document Revised: 05/31/2013 Document Reviewed: 11/24/2012 [employer] Patient Information 2015 [employer]. This information is not intended to replace advice given to you by your health care provider. Make sure you discuss any questions you have with your health care provider.  

## 2020-10-04 NOTE — Progress Notes (Signed)
[place] 12+3 wks,measurements c/w dates,crl 57.77 mm,NB present,NT 1.2 mm,fhr 157 bpm,anterior placenta,normal ovaries,

## 2020-10-04 NOTE — Progress Notes (Addendum)
INITIAL OBSTETRICAL VISIT Patient name: Wanda Craig MRN [id number]  Date of birth: Jan 16, 1996 Chief Complaint:   Initial Prenatal Visit (nausea)  History of Present Illness:   DELITHA Craig is a 25 y.o. G1P0 Caucasian female at [redacted]w[redacted]d by [place] at 10.3 weeks with an Estimated Date of Delivery: 04/15/21 being seen today for her initial obstetrical visit.   No LMP recorded (lmp unknown). Patient is pregnant. Her obstetrical history is significant for primigravida.   Today she reports nausea.  Last pap July 23, 2017. Results were: NILM w/ HRHPV not done  Depression screen [facility] 2/9 10/04/2020 08/29/2020  Decreased Interest 0 0  Down, Depressed, Hopeless 0 0  PHQ - 2 Score 0 0  Altered sleeping 0 -  Tired, decreased energy 1 -  Change in appetite 0 -  Feeling bad or failure about yourself  0 -  Trouble concentrating 0 -  Moving slowly or fidgety/restless 0 -  Suicidal thoughts 0 -  PHQ-9 Score 1 -     GAD 7 : Generalized Anxiety Score 10/04/2020  Nervous, Anxious, on Edge 0  Control/stop worrying 0  Worry too much - different things 0  Trouble relaxing 0  Restless 0  Easily annoyed or irritable 0  Afraid - awful might happen 0  Total GAD 7 Score 0     Review of Systems:   Pertinent items are noted in HPI Denies cramping/contractions, leakage of fluid, vaginal bleeding, abnormal vaginal discharge w/ itching/odor/irritation, headaches, visual changes, shortness of breath, chest pain, abdominal pain, severe nausea/vomiting, or problems with urination or bowel movements unless otherwise stated above.  Pertinent History Reviewed:  Reviewed past medical,surgical, social, obstetrical and family history.  Reviewed problem list, medications and allergies. OB History  Gravida Para Term Preterm AB Living  1            SAB IAB Ectopic Multiple Live Births               # Outcome Date GA Lbr Len/2nd Weight Sex Delivery Anes PTL Lv  1 Current            Physical Assessment:   Vitals:    10/04/20 0907  BP: 125/75  Pulse: 91  Weight: 206 lb (93.4 kg)  Body mass index is 36.49 kg/m.       Physical Examination:  General appearance - well appearing, and in no distress  Mental status - alert, oriented to person, place, and time  Psych:  She has a normal mood and affect  Skin - warm and dry, normal color, no suspicious lesions noted  Chest - effort normal, all lung fields clear to auscultation bilaterally  Heart - normal rate and regular rhythm  Abdomen - soft, nontender  Extremities:  No swelling or varicosities noted  Pelvic - not indicated  Thin prep pap is not done    TODAY'S NT [place] 12+3 wks,measurements c/w dates,crl 57.77 mm,NB present,NT 1.2 mm,fhr 157 bpm,anterior placenta,normal ovaries  Results for orders placed or performed in visit on 10/04/20 (from the past 24 hour(s))  POC Urinalysis Dipstick OB   Collection Time: 10/04/20  9:44 AM  Result Value Ref Range   Color, UA     Clarity, UA     Glucose, UA Negative Negative   Bilirubin, UA     Ketones, UA small    Spec Grav, UA     Blood, UA neg    pH, UA     POC,PROTEIN,UA Negative Negative, Trace,  Small (1+), Moderate (2+), Large (3+), 4+   Urobilinogen, UA     Nitrite, UA neg    Leukocytes, UA Negative Negative   Appearance     Odor      Assessment & Plan:  1) Low-Risk Pregnancy G1P0 at [redacted]w[redacted]d with an Estimated Date of Delivery: 04/15/21   2) Initial OB visit  3) Nullip/^BMI, rx bASA 162mg /daily   Meds:  Meds ordered this encounter  Medications   Blood Pressure Monitor MISC    Sig: For regular home bp monitoring during pregnancy    Dispense:  1 each    Refill:  0    Z34.81 Please mail to patient   aspirin 81 MG chewable tablet    Sig: Chew 2 tablets (162 mg total) by mouth daily.    Dispense:  60 tablet    Refill:  7    Order Specific Question:   Supervising Provider    Answer:   H [[id number]]    Initial labs obtained Continue prenatal vitamins Reviewed n/v relief measures  and warning s/s to report Reviewed recommended weight gain based on pre-gravid BMI Encouraged well-balanced diet Genetic & carrier screening discussed: requests Panor[facility] and NT/IT, requests Horizon  Ultrasound discussed; fetal survey: requested CCNC completed> form faxed if has or is planning to apply for medicaid The nature of [facility] with multiple MDs and other Advanced Practice Providers was explained to patient; also emphasized that fellows, residents, and students are part of our team. Does not have home bp cuff. Office bp cuff given: no. Rx sent: yes. Check bp weekly, let [employer] know if consistently >140/90.   Indications for ASA therapy (per uptodate) OR Two or more of the following: Nulliparity Yes Obesity (BMI>30 kg/m2) Yes  No indications for early A1C (per uptodate) BMI >=25 (>=23 in Asian women) AND one of the following  Follow-up: Return in about 4 weeks (around 11/01/2020) for LROB, in person, 2nd IT.   Orders Placed This Encounter  Procedures   Urine Culture   GC/Chlamydia Probe Amp   Pain Management Screening Profile (10S)   Genetic Screening   CBC/D/Plt+RPR+Rh+ABO+RubIgG...   Integrated 1   POC Urinalysis Dipstick OB    01/01/2021 [facility]  10/04/2020 1:36 PM

## 2020-10-05 LAB — CBC/D/PLT+RPR+RH+ABO+RUBIGG...
Antibody Screen: NEGATIVE
Basophils Absolute: 0 10*3/uL (ref 0.0–0.2)
Basos: 0 %
EOS (ABSOLUTE): 0.1 10*3/uL (ref 0.0–0.4)
Eos: 1 %
HCV Ab: <0.1 s/co ratio (ref 0.0–0.9)
HIV Screen 4th Generation wRfx: NONREACTIVE
Hematocrit: 36.8 % (ref 34.0–46.6)
Hemoglobin: 12.4 g/dL (ref 11.1–15.9)
Hepatitis B Surface Ag: NEGATIVE
Immature Grans (Abs): 0 10*3/uL (ref 0.0–0.1)
Immature Granulocytes: 1 %
Lymphocytes Absolute: 1.6 10*3/uL (ref 0.7–3.1)
Lymphs: 19 %
MCH: 30.5 pg (ref 26.6–33.0)
MCHC: 33.7 g/dL (ref 31.5–35.7)
MCV: 91 fL (ref 79–97)
Monocytes Absolute: 0.4 10*3/uL (ref 0.1–0.9)
Monocytes: 5 %
Neutrophils Absolute: 6.6 10*3/uL (ref 1.4–7.0)
Neutrophils: 74 %
Platelets: 375 10*3/uL (ref 150–450)
RBC: 4.06 x10E6/uL (ref 3.77–5.28)
RDW: 11.8 % (ref 11.7–15.4)
RPR Ser Ql: NONREACTIVE
Rh Factor: NEGATIVE
Rubella Antibodies, IGG: 6.45 index (ref >0.99)
WBC: 8.8 10*3/uL (ref 3.4–10.8)

## 2020-10-05 LAB — PMP SCREEN PROFILE (10S), URINE
Amphetamine Scrn, Ur: NEGATIVE ng/mL
BARBITURATE SCREEN URINE: NEGATIVE ng/mL
BENZODIAZEPINE SCREEN, URINE: NEGATIVE ng/mL
CANNABINOIDS UR QL SCN: NEGATIVE ng/mL
Cocaine (Metab) Scrn, Ur: NEGATIVE ng/mL
Creatinine(Crt), U: 172.8 mg/dL (ref 20.0–300.0)
Methadone Screen, Urine: NEGATIVE ng/mL
OXYCODONE+OXYMORPHONE UR QL SCN: NEGATIVE ng/mL
Opiate Scrn, Ur: NEGATIVE ng/mL
Ph of Urine: 6.6 (ref 4.5–8.9)
Phencyclidine Qn, Ur: NEGATIVE ng/mL
Propoxyphene Scrn, Ur: NEGATIVE ng/mL

## 2020-10-05 LAB — HCV INTERPRETATION

## 2020-10-06 LAB — GC/CHLAMYDIA PROBE AMP
Chlamydia trachomatis, NAA: NEGATIVE
Neisseria Gonorrhoeae by PCR: NEGATIVE

## 2020-10-07 LAB — URINE CULTURE

## 2020-10-09 LAB — INTEGRATED 1
Crown Rump Length: 57.7 mm
Gest. Age on Collection Date: 12.1 weeks
Maternal Age at EDD: 26.4 yr
Nuchal Translucency (NT): 1.2 mm
Number of Fetuses: 1
PAPP-A Value: 1001.7 ng/mL
Weight: 206 [lb_av]

## 2020-10-11 ENCOUNTER — Encounter: Payer: Self-pay | Admitting: Advanced Practice Midwife

## 2020-10-11 DIAGNOSIS — O26899 Other specified pregnancy related conditions, unspecified trimester: Secondary | ICD-10-CM | POA: Insufficient documentation

## 2020-10-11 DIAGNOSIS — Z6791 Unspecified blood type, Rh negative: Secondary | ICD-10-CM | POA: Insufficient documentation

## 2020-10-19 ENCOUNTER — Encounter: Payer: Self-pay | Admitting: Advanced Practice Midwife

## 2020-10-20 ENCOUNTER — Encounter: Payer: Self-pay | Admitting: Advanced Practice Midwife

## 2020-11-01 ENCOUNTER — Other Ambulatory Visit: Payer: Self-pay

## 2020-11-01 ENCOUNTER — Ambulatory Visit (INDEPENDENT_AMBULATORY_CARE_PROVIDER_SITE_OTHER): Payer: Medicaid Other | Admitting: Advanced Practice Midwife

## 2020-11-01 ENCOUNTER — Other Ambulatory Visit (HOSPITAL_COMMUNITY)
Admission: RE | Admit: 2020-11-01 | Discharge: 2020-11-01 | Disposition: A | Discharge status: Home / Self Care | Payer: Medicaid Other | Source: Ambulatory Visit | Attending: Advanced Practice Midwife | Admitting: Advanced Practice Midwife

## 2020-11-01 VITALS — BP 125/62 | HR 84 | Wt 204.4 lb

## 2020-11-01 DIAGNOSIS — Z3A16 16 weeks gestation of pregnancy: Secondary | ICD-10-CM

## 2020-11-01 DIAGNOSIS — Z3402 Encounter for supervision of normal first pregnancy, second trimester: Secondary | ICD-10-CM

## 2020-11-01 DIAGNOSIS — Z363 Encounter for antenatal screening for malformations: Secondary | ICD-10-CM

## 2020-11-01 DIAGNOSIS — Z1379 Encounter for other screening for genetic and chromosomal anomalies: Secondary | ICD-10-CM

## 2020-11-01 DIAGNOSIS — Z01419 Encounter for gynecological examination (general) (routine) without abnormal findings: Secondary | ICD-10-CM

## 2020-11-01 NOTE — Progress Notes (Signed)
   LOW-RISK PREGNANCY VISIT Patient name: Wanda Craig MRN [id number]  Date of birth: 01-11-1996 Chief Complaint:   Routine Prenatal Visit  History of Present Illness:   Wanda Craig is a 25 y.o. G1P0 female at [redacted]w[redacted]d with an Estimated Date of Delivery: 04/15/21 being seen today for ongoing management of a low-risk pregnancy.  Today she reports  nausea much better! . Contractions: Not present. Vag. Bleeding: None.  Movement: Present. denies leaking of fluid. Review of Systems:   Pertinent items are noted in HPI Denies abnormal vaginal discharge w/ itching/odor/irritation, headaches, visual changes, shortness of breath, chest pain, abdominal pain, severe nausea/vomiting, or problems with urination or bowel movements unless otherwise stated above. Pertinent History Reviewed:  Reviewed past medical,surgical, social, obstetrical and family history.  Reviewed problem list, medications and allergies. Physical Assessment:   Vitals:   11/01/20 1028  BP: 125/62  Pulse: 84  Weight: 204 lb 6.4 oz (92.7 kg)  Body mass index is 36.21 kg/m.        Physical Examination:   General appearance: Well appearing, and in no distress  Mental status: Alert, oriented to person, place, and time  Skin: Warm & dry  Cardiovascular: Normal heart rate noted  Respiratory: Normal respiratory effort, no distress  Abdomen: Soft, gravid, nontender  Pelvic: Cervical exam deferred; Pap collected        Extremities: Edema: None  Fetal Status: Fetal Heart Rate (bpm): 140   Movement: Present    No results found for this or any previous visit (from the past 24 hour(s)).  Assessment & Plan:  1) Low-risk pregnancy G1P0 at [redacted]w[redacted]d with an Estimated Date of Delivery: 04/15/21     Meds: No orders of the defined types were placed in this encounter.  Labs/procedures today: Pap; 2nd IT  Plan:  Continue routine obstetrical care   Reviewed: Preterm labor symptoms and general obstetric precautions including but not limited to  vaginal bleeding, contractions, leaking of fluid and fetal movement were reviewed in detail with the patient.  All questions were answered. Has home bp cuff.  Check bp weekly, let us know if >140/90.   Follow-up: Return for 3-4weeks; anatomy u/s; LROB.  Orders Placed This Encounter  Procedures   US OB Comp + 14 Wk   INTEGRATED 2   Arabella Merles [facility] 11/01/2020 10:53 AM

## 2020-11-01 NOTE — Patient Instructions (Signed)
Wanda Craig, thank you for choosing our office today! We appreciate the opportunity to meet your healthcare needs. You may receive a short survey by mail, e-mail, or through MyChart. If you are happy with your care we would appreciate if you could take just a few minutes to complete the survey questions. We read all of your comments and take your feedback very seriously. Thank you again for choosing our office.  [facility] Team at [facility] ([address] [place], [place] [address]) Entrance C, located off of [address] Free 24/7 valet parking  Go to [email or web address] to register for FREE online childbirth classes  Call the office ([phone]) or go to [facility] if: You begin to severe cramping Your water breaks.  Sometimes it is a big gush of fluid, sometimes it is just a trickle that keeps getting your panties wet or running down your legs You have vaginal bleeding.  It is normal to have a small amount of spotting if your cervix was checked.   [place] Pediatricians/Family Doctors [facility] ([facility]): [address], [phone]           [facility]: [address], [phone]                 [facility] ([facility]): [address], [phone] (call to ask if accepting patients) [facility]: [address], [place], [phone]    [place] Pediatricians/Family Doctors [facility] ([facility]): [address], [phone] [facility]: [address], [phone] [facility]: [address], [phone]  [place] [facility]  [facility] ([facility]): [phone] [facility]: [address], [phone]   [place] Family Doctors [facility]: [address], [phone]  [place] [facility]  [facility]: [address], [phone]  Home Blood Pressure Monitoring for Patients   Your provider has recommended that you check your blood pressure (BP) at least once a week at home. If you do not have a blood pressure cuff at home, one will be provided for you. Contact your provider if you have not received your monitor within 1 week.   Helpful Tips for Accurate Home Blood Pressure Checks  Don't smoke, exercise, or drink caffeine 30 minutes before checking your BP Use the restroom before checking your BP (a full bladder can raise your pressure) Relax in a comfortable upright chair Feet on the ground Left arm resting comfortably on a flat surface at the level of your heart Legs uncrossed Back supported Sit quietly and don't talk Place the cuff on your bare arm Adjust snuggly, so that only two fingertips can fit between your skin and the top of the cuff Check 2 readings separated by at least one minute Keep a log of your BP readings For a visual, please reference this diagram: http://ccnc.care/bpdiagram  Provider Name: [facility]     Phone: [phone]  Zone 1: ALL CLEAR  Continue to monitor your symptoms:  BP reading is less than 140 (top number) or less than 90 (bottom number)  No right upper stomach pain No headaches or seeing spots No feeling nauseated or throwing up No swelling in face and hands  Zone 2: CAUTION Call your doctor's office for any of the following:  BP reading is greater than 140 (top number) or greater than   90 (bottom number)  Stomach pain under your ribs in the middle or right side Headaches or seeing spots Feeling nauseated or throwing up Swelling in face and hands  Zone 3: EMERGENCY  Seek immediate medical care if you have any of the following:  BP reading is greater than160 (top number) or greater than 110 (bottom number) Severe headaches not improving with Tylenol Serious difficulty catching your breath Any worsening symptoms from  Zone 2     Second Trimester of Pregnancy The second trimester is from week 14 through week 27 (months 4 through 6). The second trimester is often a time when you feel your best. Your body has adjusted to being pregnant, and you begin to feel better physically. Usually, morning sickness has lessened or quit completely, you may have more energy, and you may have an increase in appetite. The second trimester is also a time when the fetus is growing rapidly. At the end of the sixth month, the fetus is about 9 inches long and weighs about 1 pounds. You will likely begin to feel the baby move (quickening) between 16 and 20 weeks of pregnancy. Body changes during your second trimester Your body continues to go through many changes during your second trimester. The changes vary from woman to woman. Your weight will continue to increase. You will notice your lower abdomen bulging out. You may begin to get stretch marks on your hips, abdomen, and breasts. You may develop headaches that can be relieved by medicines. The medicines should be approved by your health care provider. You may urinate more often because the fetus is pressing on your bladder. You may develop or continue to have heartburn as a result of your pregnancy. You may develop constipation because certain hormones are causing the muscles that push waste through your intestines to slow down. You may develop hemorrhoids or swollen, bulging veins (varicose veins). You may have back pain. This is caused by: Weight gain. Pregnancy hormones that are relaxing the joints in your pelvis. A shift in weight and the muscles that support your balance. Your breasts will continue to grow and they will continue to become tender. Your gums may bleed and may be sensitive to brushing and flossing. Dark spots or blotches (chloasma, mask of pregnancy) may develop on your face. This will likely fade after the baby is born. A dark line from your belly button to  the pubic area (linea nigra) may appear. This will likely fade after the baby is born. You may have changes in your hair. These can include thickening of your hair, rapid growth, and changes in texture. Some women also have hair loss during or after pregnancy, or hair that feels dry or thin. Your hair will most likely return to normal after your baby is born.  What to expect at prenatal visits During a routine prenatal visit: You will be weighed to make sure you and the fetus are growing normally. Your blood pressure will be taken. Your abdomen will be measured to track your baby's growth. The fetal heartbeat will be listened to. Any test results from the previous visit will be discussed.  Your health care provider may ask you: How you are feeling. If you are feeling the baby move. If you have had any abnormal symptoms, such as leaking fluid, bleeding, severe headaches, or abdominal cramping. If you are using any tobacco products, including cigarettes, chewing tobacco, and electronic cigarettes. If you have any questions.  Other tests that may be performed during   your second trimester include: Blood tests that check for: Low iron levels (anemia). High blood sugar that affects pregnant women (gestational diabetes) between 24 and 28 weeks. Rh antibodies. This is to check for a protein on red blood cells (Rh factor). Urine tests to check for infections, diabetes, or protein in the urine. An ultrasound to confirm the proper growth and development of the baby. An amniocentesis to check for possible genetic problems. Fetal screens for spina bifida and Down syndrome. HIV (human immunodeficiency virus) testing. Routine prenatal testing includes screening for HIV, unless you choose not to have this test.  Follow these instructions at home: Medicines Follow your health care provider's instructions regarding medicine use. Specific medicines may be either safe or unsafe to take during  pregnancy. Take a prenatal vitamin that contains at least 600 micrograms (mcg) of folic acid. If you develop constipation, try taking a stool softener if your health care provider approves. Eating and drinking Eat a balanced diet that includes fresh fruits and vegetables, whole grains, good sources of protein such as meat, eggs, or tofu, and low-fat dairy. Your health care provider will help you determine the amount of weight gain that is right for you. Avoid raw meat and uncooked cheese. These carry germs that can cause birth defects in the baby. If you have low calcium intake from food, talk to your health care provider about whether you should take a daily calcium supplement. Limit foods that are high in fat and processed sugars, such as fried and sweet foods. To prevent constipation: Drink enough fluid to keep your urine clear or pale yellow. Eat foods that are high in fiber, such as fresh fruits and vegetables, whole grains, and beans. Activity Exercise only as directed by your health care provider. Most women can continue their usual exercise routine during pregnancy. Try to exercise for 30 minutes at least 5 days a week. Stop exercising if you experience uterine contractions. Avoid heavy lifting, wear low heel shoes, and practice good posture. A sexual relationship may be continued unless your health care provider directs you otherwise. Relieving pain and discomfort Wear a good support bra to prevent discomfort from breast tenderness. Take warm sitz baths to soothe any pain or discomfort caused by hemorrhoids. Use hemorrhoid cream if your health care provider approves. Rest with your legs elevated if you have leg cramps or low back pain. If you develop varicose veins, wear support hose. Elevate your feet for 15 minutes, 3-4 times a day. Limit salt in your diet. Prenatal Care Write down your questions. Take them to your prenatal visits. Keep all your prenatal visits as told by your health  care provider. This is important. Safety Wear your seat belt at all times when driving. Make a list of emergency phone numbers, including numbers for family, friends, the hospital, and police and fire departments. General instructions Ask your health care provider for a referral to a local prenatal education class. Begin classes no later than the beginning of month 6 of your pregnancy. Ask for help if you have counseling or nutritional needs during pregnancy. Your health care provider can offer advice or refer you to specialists for help with various needs. Do not use hot tubs, steam rooms, or saunas. Do not douche or use tampons or scented sanitary pads. Do not cross your legs for long periods of time. Avoid cat litter boxes and soil used by cats. These carry germs that can cause birth defects in the baby and possibly loss of the   fetus by miscarriage or stillbirth. Avoid all smoking, herbs, alcohol, and unprescribed drugs. Chemicals in these products can affect the formation and growth of the baby. Do not use any products that contain nicotine or tobacco, such as cigarettes and e-cigarettes. If you need help quitting, ask your health care provider. Visit your dentist if you have not gone yet during your pregnancy. Use a soft toothbrush to brush your teeth and be gentle when you floss. Contact a health care provider if: You have dizziness. You have mild pelvic cramps, pelvic pressure, or nagging pain in the abdominal area. You have persistent nausea, vomiting, or diarrhea. You have a bad smelling vaginal discharge. You have pain when you urinate. Get help right away if: You have a fever. You are leaking fluid from your vagina. You have spotting or bleeding from your vagina. You have severe abdominal cramping or pain. You have rapid weight gain or weight loss. You have shortness of breath with chest pain. You notice sudden or extreme swelling of your face, hands, ankles, feet, or legs. You  have not felt your baby move in over an hour. You have severe headaches that do not go away when you take medicine. You have vision changes. Summary The second trimester is from week 14 through week 27 (months 4 through 6). It is also a time when the fetus is growing rapidly. Your body goes through many changes during pregnancy. The changes vary from woman to woman. Avoid all smoking, herbs, alcohol, and unprescribed drugs. These chemicals affect the formation and growth your baby. Do not use any tobacco products, such as cigarettes, chewing tobacco, and e-cigarettes. If you need help quitting, ask your health care provider. Contact your health care provider if you have any questions. Keep all prenatal visits as told by your health care provider. This is important. This information is not intended to replace advice given to you by your health care provider. Make sure you discuss any questions you have with your health care provider. Document Released: 01/08/2001 Document Revised: 06/22/2015 Document Reviewed: 03/17/2012 Elsevier Interactive Patient Education  2017 [employer]vier Inc.  

## 2020-11-03 LAB — INTEGRATED 2
AFP MoM: 1.05
Alpha-Fetoprotein: 31.2 ng/mL
Crown Rump Length: 57.7 mm
DIA MoM: 0.81
DIA Value: 115.5 pg/mL
Estriol, Unconjugated: 0.69 ng/mL
Gest. Age on Collection Date: 12.1 weeks
Gestational Age: 16.1 weeks
Maternal Age at EDD: 26.4 yr
Nuchal Translucency (NT): 1.2 mm
Nuchal Translucency MoM: 0.93
Number of Fetuses: 1
PAPP-A MoM: 1.76
PAPP-A Value: 1001.7 ng/mL
Test Results:: NEGATIVE
Weight: 179 [lb_av]
Weight: 206 [lb_av]
hCG MoM: 1.51
hCG Value: 50.8 IU/mL
uE3 MoM: 0.74

## 2020-11-06 LAB — CYTOLOGY - PAP
Adequacy: ABSENT
Comment: NEGATIVE
Diagnosis: NEGATIVE
High risk HPV: NEGATIVE

## 2020-11-28 ENCOUNTER — Ambulatory Visit (INDEPENDENT_AMBULATORY_CARE_PROVIDER_SITE_OTHER): Payer: Medicaid Other | Admitting: Women's Health

## 2020-11-28 ENCOUNTER — Ambulatory Visit (INDEPENDENT_AMBULATORY_CARE_PROVIDER_SITE_OTHER): Payer: Medicaid Other

## 2020-11-28 ENCOUNTER — Other Ambulatory Visit: Payer: Self-pay

## 2020-11-28 ENCOUNTER — Encounter: Payer: Self-pay | Admitting: Women's Health

## 2020-11-28 VITALS — BP 131/68 | HR 89 | Wt 209.0 lb

## 2020-11-28 DIAGNOSIS — Z363 Encounter for antenatal screening for malformations: Secondary | ICD-10-CM

## 2020-11-28 DIAGNOSIS — Z3A20 20 weeks gestation of pregnancy: Secondary | ICD-10-CM

## 2020-11-28 DIAGNOSIS — Z3402 Encounter for supervision of normal first pregnancy, second trimester: Secondary | ICD-10-CM

## 2020-11-28 DIAGNOSIS — O26899 Other specified pregnancy related conditions, unspecified trimester: Secondary | ICD-10-CM

## 2020-11-28 DIAGNOSIS — Z3482 Encounter for supervision of other normal pregnancy, second trimester: Secondary | ICD-10-CM

## 2020-11-28 NOTE — Progress Notes (Signed)
    LOW-RISK PREGNANCY VISIT Patient name: Wanda Craig MRN [id number]  Date of birth: Dec 09, 1995 Chief Complaint:   Routine Prenatal Visit  History of Present Illness:   Wanda Craig is a 25 y.o. G1P0 female at [redacted]w[redacted]d with an Estimated Date of Delivery: 04/15/21 being seen today for ongoing management of a low-risk pregnancy.   Today she reports  hip pain . Contractions: Not present. Vag. Bleeding: None.  Movement: Present. denies leaking of fluid.  Depression screen [facility] 2/9 10/04/2020 08/29/2020  Decreased Interest 0 0  Down, Depressed, Hopeless 0 0  PHQ - 2 Score 0 0  Altered sleeping 0 -  Tired, decreased energy 1 -  Change in appetite 0 -  Feeling bad or failure about yourself  0 -  Trouble concentrating 0 -  Moving slowly or fidgety/restless 0 -  Suicidal thoughts 0 -  PHQ-9 Score 1 -     GAD 7 : Generalized Anxiety Score 10/04/2020  Nervous, Anxious, on Edge 0  Control/stop worrying 0  Worry too much - different things 0  Trouble relaxing 0  Restless 0  Easily annoyed or irritable 0  Afraid - awful might happen 0  Total GAD 7 Score 0      Review of Systems:   Pertinent items are noted in HPI Denies abnormal vaginal discharge w/ itching/odor/irritation, headaches, visual changes, shortness of breath, chest pain, abdominal pain, severe nausea/vomiting, or problems with urination or bowel movements unless otherwise stated above. Pertinent History Reviewed:  Reviewed past medical,surgical, social, obstetrical and family history.  Reviewed problem list, medications and allergies. Physical Assessment:   Vitals:   11/28/20 0947  BP: 131/68  Pulse: 89  Weight: 209 lb (94.8 kg)  Body mass index is 37.02 kg/m.        Physical Examination:   General appearance: Well appearing, and in no distress  Mental status: Alert, oriented to person, place, and time  Skin: Warm & dry  Cardiovascular: Normal heart rate noted  Respiratory: Normal respiratory effort, no  distress  Abdomen: Soft, gravid, nontender  Pelvic: Cervical exam deferred         Extremities: Edema: None  Fetal Status: Fetal Heart Rate (bpm): 140 u/s   Movement: Present  [place] 20+2 wks,cephalic,anterior placenta gr 0,normal ovaries,cx 4 cm,SVP 4.8 cm,FHR 140 bpm,EFW 380 g 74%,anatomy complete,no obvious abnormalities   Chaperone: N/A   No results found for this or any previous visit (from the past 24 hour(s)).  Assessment & Plan:  1) Low-risk pregnancy G1P0 at [redacted]w[redacted]d with an Estimated Date of Delivery: 04/15/21   2) Hip pain, discussed pain relief measures   Meds: No orders of the defined types were placed in this encounter.  Labs/procedures today: U/S  Plan:  Continue routine obstetrical care  Next visit: prefers in person    Reviewed: Preterm labor symptoms and general obstetric precautions including but not limited to vaginal bleeding, contractions, leaking of fluid and fetal movement were reviewed in detail with the patient.  All questions were answered. Does have home bp cuff. Office bp cuff given: not applicable. Check bp weekly, let us know if consistently >140 and/or >90.  Follow-up: Return in about 4 weeks (around 12/26/2020) for LROB, CNM, in person.  No future appointments.  No orders of the defined types were placed in this encounter.  Cheral Marker CNM, [facility] 11/28/2020 10:22 AM

## 2020-11-28 NOTE — Patient Instructions (Signed)
Wanda Craig, thank you for choosing our office today! We appreciate the opportunity to meet your healthcare needs. You may receive a short survey by mail, e-mail, or through MyChart. If you are happy with your care we would appreciate if you could take just a few minutes to complete the survey questions. We read all of your comments and take your feedback very seriously. Thank you again for choosing our office.  [facility] Team at [facility] [place] ([address] [place], [place] [address]) Entrance C, located off of [address] Free 24/7 valet parking  Go to [email or web address] to register for FREE online childbirth classes  Call the office ([phone]) or go to [facility] if: You begin to severe cramping Your water breaks.  Sometimes it is a big gush of fluid, sometimes it is just a trickle that keeps getting your panties wet or running down your legs You have vaginal bleeding.  It is normal to have a small amount of spotting if your cervix was checked.   [place] Pediatricians/Family Doctors [facility] ([facility]): [address], [phone]           [facility]: [address], [phone]                [facility] ([facility]): [address], [phone] (call to ask if accepting patients) [facility]: [address], [place], [phone]    [place] Pediatricians/Family Doctors [facility] ([facility]): [address], [phone] [facility]: [address], [phone] [facility]: [address], [phone]  [place] [facility]  [facility] ([facility]): [phone] [facility]: [address], [phone]   [place] [facility]: [address], [phone]  [place] [facility]  [facility]: [address], [phone]  Home Blood Pressure Monitoring for Patients   Your provider has recommended that you check your blood pressure (BP) at least once a week at home. If you do not have a blood pressure cuff at home, one will be provided for you. Contact your provider if you have not received your monitor within 1 week.   Helpful Tips for Accurate Home Blood Pressure Checks  Don't smoke, exercise, or drink caffeine 30 minutes before checking your BP Use the restroom before checking your BP (a full bladder can raise your pressure) Relax in a comfortable upright chair Feet on the ground Left arm resting comfortably on a flat surface at the level of your heart Legs uncrossed Back supported Sit quietly and don't talk Place the cuff on your bare arm Adjust snuggly, so that only two fingertips can fit between your skin and the top of the cuff Check 2 readings separated by at least one minute Keep a log of your BP readings For a visual, please reference this diagram: http://ccnc.care/bpdiagram  Provider Name: [facility]     Phone: [phone]  Zone 1: ALL CLEAR  Continue to monitor your symptoms:  BP reading is less than 140 (top number) or less than 90 (bottom number)  No right upper stomach pain No headaches or seeing spots No feeling nauseated or throwing up No swelling in face and hands  Zone 2: CAUTION Call your doctor's office for any of the following:  BP reading is greater than 140 (top number) or greater than   90 (bottom number)  Stomach pain under your ribs in the middle or right side Headaches or seeing spots Feeling nauseated or throwing up Swelling in face and hands  Zone 3: EMERGENCY  Seek immediate medical care if you have any of the following:  BP reading is greater than160 (top number) or greater than 110 (bottom number) Severe headaches not improving with Tylenol Serious difficulty catching your breath Any worsening symptoms from  Zone 2     Second Trimester of Pregnancy The second trimester is from week 14 through week 27 (months 4 through 6). The second trimester is often a time when you feel your best. Your body has adjusted to being pregnant, and you begin to feel better physically. Usually, morning sickness has lessened or quit completely, you may have more energy, and you may have an increase in appetite. The second trimester is also a time when the fetus is growing rapidly. At the end of the sixth month, the fetus is about 9 inches long and weighs about 1 pounds. You will likely begin to feel the baby move (quickening) between 16 and 20 weeks of pregnancy. Body changes during your second trimester Your body continues to go through many changes during your second trimester. The changes vary from woman to woman. Your weight will continue to increase. You will notice your lower abdomen bulging out. You may begin to get stretch marks on your hips, abdomen, and breasts. You may develop headaches that can be relieved by medicines. The medicines should be approved by your health care provider. You may urinate more often because the fetus is pressing on your bladder. You may develop or continue to have heartburn as a result of your pregnancy. You may develop constipation because certain hormones are causing the muscles that push waste through your intestines to slow down. You may develop hemorrhoids or swollen, bulging veins (varicose veins). You may have back pain. This is caused by: Weight gain. Pregnancy hormones that are relaxing the joints in your pelvis. A shift in weight and the muscles that support your balance. Your breasts will continue to grow and they will continue to become tender. Your gums may bleed and may be sensitive to brushing and flossing. Dark spots or blotches (chloasma, mask of pregnancy) may develop on your face. This will likely fade after the baby is born. A dark line from your belly button to  the pubic area (linea nigra) may appear. This will likely fade after the baby is born. You may have changes in your hair. These can include thickening of your hair, rapid growth, and changes in texture. Some women also have hair loss during or after pregnancy, or hair that feels dry or thin. Your hair will most likely return to normal after your baby is born.  What to expect at prenatal visits During a routine prenatal visit: You will be weighed to make sure you and the fetus are growing normally. Your blood pressure will be taken. Your abdomen will be measured to track your baby's growth. The fetal heartbeat will be listened to. Any test results from the previous visit will be discussed.  Your health care provider may ask you: How you are feeling. If you are feeling the baby move. If you have had any abnormal symptoms, such as leaking fluid, bleeding, severe headaches, or abdominal cramping. If you are using any tobacco products, including cigarettes, chewing tobacco, and electronic cigarettes. If you have any questions.  Other tests that may be performed during   your second trimester include: Blood tests that check for: Low iron levels (anemia). High blood sugar that affects pregnant women (gestational diabetes) between 24 and 28 weeks. Rh antibodies. This is to check for a protein on red blood cells (Rh factor). Urine tests to check for infections, diabetes, or protein in the urine. An ultrasound to confirm the proper growth and development of the baby. An amniocentesis to check for possible genetic problems. Fetal screens for spina bifida and Down syndrome. HIV (human immunodeficiency virus) testing. Routine prenatal testing includes screening for HIV, unless you choose not to have this test.  Follow these instructions at home: Medicines Follow your health care provider's instructions regarding medicine use. Specific medicines may be either safe or unsafe to take during  pregnancy. Take a prenatal vitamin that contains at least 600 micrograms (mcg) of folic acid. If you develop constipation, try taking a stool softener if your health care provider approves. Eating and drinking Eat a balanced diet that includes fresh fruits and vegetables, whole grains, good sources of protein such as meat, eggs, or tofu, and low-fat dairy. Your health care provider will help you determine the amount of weight gain that is right for you. Avoid raw meat and uncooked cheese. These carry germs that can cause birth defects in the baby. If you have low calcium intake from food, talk to your health care provider about whether you should take a daily calcium supplement. Limit foods that are high in fat and processed sugars, such as fried and sweet foods. To prevent constipation: Drink enough fluid to keep your urine clear or pale yellow. Eat foods that are high in fiber, such as fresh fruits and vegetables, whole grains, and beans. Activity Exercise only as directed by your health care provider. Most women can continue their usual exercise routine during pregnancy. Try to exercise for 30 minutes at least 5 days a week. Stop exercising if you experience uterine contractions. Avoid heavy lifting, wear low heel shoes, and practice good posture. A sexual relationship may be continued unless your health care provider directs you otherwise. Relieving pain and discomfort Wear a good support bra to prevent discomfort from breast tenderness. Take warm sitz baths to soothe any pain or discomfort caused by hemorrhoids. Use hemorrhoid cream if your health care provider approves. Rest with your legs elevated if you have leg cramps or low back pain. If you develop varicose veins, wear support hose. Elevate your feet for 15 minutes, 3-4 times a day. Limit salt in your diet. Prenatal Care Write down your questions. Take them to your prenatal visits. Keep all your prenatal visits as told by your health  care provider. This is important. Safety Wear your seat belt at all times when driving. Make a list of emergency phone numbers, including numbers for family, friends, the hospital, and police and fire departments. General instructions Ask your health care provider for a referral to a local prenatal education class. Begin classes no later than the beginning of month 6 of your pregnancy. Ask for help if you have counseling or nutritional needs during pregnancy. Your health care provider can offer advice or refer you to specialists for help with various needs. Do not use hot tubs, steam rooms, or saunas. Do not douche or use tampons or scented sanitary pads. Do not cross your legs for long periods of time. Avoid cat litter boxes and soil used by cats. These carry germs that can cause birth defects in the baby and possibly loss of the   fetus by miscarriage or stillbirth. Avoid all smoking, herbs, alcohol, and unprescribed drugs. Chemicals in these products can affect the formation and growth of the baby. Do not use any products that contain nicotine or tobacco, such as cigarettes and e-cigarettes. If you need help quitting, ask your health care provider. Visit your dentist if you have not gone yet during your pregnancy. Use a soft toothbrush to brush your teeth and be gentle when you floss. Contact a health care provider if: You have dizziness. You have mild pelvic cramps, pelvic pressure, or nagging pain in the abdominal area. You have persistent nausea, vomiting, or diarrhea. You have a bad smelling vaginal discharge. You have pain when you urinate. Get help right away if: You have a fever. You are leaking fluid from your vagina. You have spotting or bleeding from your vagina. You have severe abdominal cramping or pain. You have rapid weight gain or weight loss. You have shortness of breath with chest pain. You notice sudden or extreme swelling of your face, hands, ankles, feet, or legs. You  have not felt your baby move in over an hour. You have severe headaches that do not go away when you take medicine. You have vision changes. Summary The second trimester is from week 14 through week 27 (months 4 through 6). It is also a time when the fetus is growing rapidly. Your body goes through many changes during pregnancy. The changes vary from woman to woman. Avoid all smoking, herbs, alcohol, and unprescribed drugs. These chemicals affect the formation and growth your baby. Do not use any tobacco products, such as cigarettes, chewing tobacco, and e-cigarettes. If you need help quitting, ask your health care provider. Contact your health care provider if you have any questions. Keep all prenatal visits as told by your health care provider. This is important. This information is not intended to replace advice given to you by your health care provider. Make sure you discuss any questions you have with your health care provider. Document Released: 01/08/2001 Document Revised: 06/22/2015 Document Reviewed: 03/17/2012 Elsevier Interactive Patient Education  2017 [employer]vier Inc.  

## 2020-11-28 NOTE — Progress Notes (Signed)
[place] 20+2 wks,cephalic,anterior placenta gr 0,normal ovaries,cx 4 cm,SVP 4.8 cm,FHR 140 bpm,EFW 380 g 74%,anatomy complete,no obvious abnormalities

## 2020-12-04 ENCOUNTER — Other Ambulatory Visit: Payer: Self-pay

## 2020-12-04 ENCOUNTER — Ambulatory Visit
Admission: EM | Admit: 2020-12-04 | Discharge: 2020-12-04 | Disposition: A | Discharge status: Home / Self Care | Payer: Medicaid Other

## 2020-12-04 ENCOUNTER — Encounter: Payer: Self-pay | Admitting: Emergency Medicine

## 2020-12-04 DIAGNOSIS — H6982 Other specified disorders of Eustachian tube, left ear: Secondary | ICD-10-CM

## 2020-12-04 DIAGNOSIS — Z3A22 22 weeks gestation of pregnancy: Secondary | ICD-10-CM

## 2020-12-04 MED ORDER — PSEUDOEPHEDRINE HCL 60 MG PO TABS: 60.0000 mg | ORAL_TABLET | Freq: Three times a day (TID) | ORAL | 0 refills | Status: DC | PRN

## 2020-12-04 MED ORDER — FLUTICASONE PROPIONATE 50 MCG/ACT NA SUSP: 2.0000 | Freq: Every day | NASAL | 12 refills | Status: DC

## 2020-12-04 NOTE — ED Triage Notes (Signed)
Congestion for 1 week Left ear pain since Friday, has tried decongestants without relief.

## 2020-12-04 NOTE — ED Provider Notes (Signed)
[facility]   MRN: [id number] DOB: 04-25-1995  Subjective:   Wanda Craig is a 25 y.o. female [redacted] weeks pregnant presenting for 1 week history of nasal congestion, post-nasal drainage. Has gotten relief from using Zyrtec, Mucinex dm. Still has issues with her left ear popping, fullness. No drainage, fever, tinnitus.  Had ear tubes as a child.  No sinus pain, throat pain, ear pain, chest pain, shortness of breath.   No current facility-administered medications for this encounter.  Current Outpatient Medications:    aspirin 81 MG chewable tablet, Chew 2 tablets (162 mg total) by mouth daily., Disp: 60 tablet, Rfl: 7   Blood Pressure Monitor MISC, For regular home bp monitoring during pregnancy (Patient not taking: Reported on 11/28/2020), Disp: 1 each, Rfl: 0   cetirizine (ZYRTEC) 10 MG tablet, Take 10 mg by mouth daily., Disp: , Rfl:    promethazine (PHENERGAN) 25 MG tablet, Take 0.5-1 tablets (12.5-25 mg total) by mouth every 6 (six) hours as needed for nausea or vomiting. (Patient not taking: No sig reported), Disp: 30 tablet, Rfl: 0   Allergies  Allergen Reactions   Other Other (See Comments)    Pt requests no narcotic orders.  States she has no substance issues; she states she prefers to be cognizant of whether or not she is getting better and does not want her pain to be "muted."     Past Medical History:  Diagnosis Date   Medical history non-contributory      Past Surgical History:  Procedure Laterality Date   NO PAST SURGERIES      Family History  Problem Relation Age of Onset   Arthritis Mother    Breast cancer Maternal Aunt    Diabetes Paternal Uncle    Dementia Maternal Grandmother    Mental illness Paternal Grandmother    Schizophrenia Paternal Grandmother     Social History   Tobacco Use   Smoking status: Never   Smokeless tobacco: Never  Vaping Use   Vaping Use: Never used  Substance Use Topics   Alcohol use: Not Currently   Drug use:  No    ROS   Objective:   Vitals: BP 110/73 (BP Location: Right Arm)   Pulse 85   Temp 98.3 F (36.8 C) (Tympanic)   Resp 16   LMP  (LMP Unknown)   SpO2 97%   Physical Exam Constitutional:      General: She is not in acute distress.    Appearance: She is well-developed. She is not ill-appearing.  HENT:     Head: Normocephalic and atraumatic.     Right Ear: Tympanic membrane, ear canal and external ear normal. No drainage or tenderness. No middle ear effusion. There is no impacted cerumen. Tympanic membrane is not erythematous.     Left Ear: Tympanic membrane, ear canal and external ear normal. No drainage or tenderness.  No middle ear effusion. There is no impacted cerumen. Tympanic membrane is not erythematous.     Nose: No congestion or rhinorrhea.     Mouth/Throat:     Mouth: Mucous membranes are moist. No oral lesions.     Pharynx: Oropharynx is clear. No pharyngeal swelling, oropharyngeal exudate, posterior oropharyngeal erythema or uvula swelling.     Tonsils: No tonsillar exudate or tonsillar abscesses.  Eyes:     Extraocular Movements:     Right eye: Normal extraocular motion.     Left eye: Normal extraocular motion.     Conjunctiva/sclera: Conjunctivae normal.  Pupils: Pupils are equal, round, and reactive to light.  Cardiovascular:     Rate and Rhythm: Normal rate.  Pulmonary:     Effort: Pulmonary effort is normal.  Musculoskeletal:     Cervical back: Normal range of motion and neck supple.  Lymphadenopathy:     Cervical: No cervical adenopathy.  Skin:    General: Skin is warm and dry.  Neurological:     General: No focal deficit present.     Mental Status: She is alert and oriented to person, place, and time.  Psychiatric:        Mood and Affect: Mood normal.        Behavior: Behavior normal.     Assessment and Plan :   PDMP not reviewed this encounter.  1. Eustachian tube dysfunction, left   2. [redacted] weeks gestation of pregnancy     Unremarkable ENT exam.  Will use conservative management for what I suspect is eustachian tube dysfunction.  Recommended starting Flonase, Zyrtec, Sudafed. Counseled patient on potential for adverse effects with medications prescribed/recommended today, ER and return-to-clinic precautions discussed, patient verbalized understanding.    Wallis Bamberg, PA-C 12/04/20 7412

## 2020-12-26 ENCOUNTER — Encounter: Payer: Self-pay | Admitting: Women's Health

## 2020-12-26 ENCOUNTER — Other Ambulatory Visit: Payer: Self-pay

## 2020-12-26 ENCOUNTER — Ambulatory Visit (INDEPENDENT_AMBULATORY_CARE_PROVIDER_SITE_OTHER): Payer: Medicaid Other | Admitting: Women's Health

## 2020-12-26 VITALS — BP 119/75 | HR 93 | Wt 217.0 lb

## 2020-12-26 DIAGNOSIS — Z3402 Encounter for supervision of normal first pregnancy, second trimester: Secondary | ICD-10-CM

## 2020-12-26 NOTE — Progress Notes (Signed)
    LOW-RISK PREGNANCY VISIT Patient name: Wanda Craig MRN [id number]  Date of birth: 02-20-1995 Chief Complaint:   Routine Prenatal Visit  History of Present Illness:   Wanda Craig is a 25 y.o. G1P0 female at [redacted]w[redacted]d with an Estimated Date of Delivery: 04/15/21 being seen today for ongoing management of a low-risk pregnancy.   Today she reports no complaints. Contractions: Irritability. Vag. Bleeding: None.  Movement: Present. denies leaking of fluid.  Depression screen [facility] 2/9 10/04/2020 08/29/2020  Decreased Interest 0 0  Down, Depressed, Hopeless 0 0  PHQ - 2 Score 0 0  Altered sleeping 0 -  Tired, decreased energy 1 -  Change in appetite 0 -  Feeling bad or failure about yourself  0 -  Trouble concentrating 0 -  Moving slowly or fidgety/restless 0 -  Suicidal thoughts 0 -  PHQ-9 Score 1 -     GAD 7 : Generalized Anxiety Score 10/04/2020  Nervous, Anxious, on Edge 0  Control/stop worrying 0  Worry too much - different things 0  Trouble relaxing 0  Restless 0  Easily annoyed or irritable 0  Afraid - awful might happen 0  Total GAD 7 Score 0      Review of Systems:   Pertinent items are noted in HPI Denies abnormal vaginal discharge w/ itching/odor/irritation, headaches, visual changes, shortness of breath, chest pain, abdominal pain, severe nausea/vomiting, or problems with urination or bowel movements unless otherwise stated above. Pertinent History Reviewed:  Reviewed past medical,surgical, social, obstetrical and family history.  Reviewed problem list, medications and allergies. Physical Assessment:   Vitals:   12/26/20 1012  BP: 119/75  Pulse: 93  Weight: 217 lb (98.4 kg)  Body mass index is 38.44 kg/m.        Physical Examination:   General appearance: Well appearing, and in no distress  Mental status: Alert, oriented to person, place, and time  Skin: Warm & dry  Cardiovascular: Normal heart rate noted  Respiratory: Normal respiratory effort, no  distress  Abdomen: Soft, gravid, nontender  Pelvic: Cervical exam deferred         Extremities: Edema: None  Fetal Status: Fetal Heart Rate (bpm): 140 Fundal Height: 25 cm Movement: Present    Chaperone: N/A   No results found for this or any previous visit (from the past 24 hour(s)).  Assessment & Plan:  1) Low-risk pregnancy G1P0 at [redacted]w[redacted]d with an Estimated Date of Delivery: 04/15/21    Meds: No orders of the defined types were placed in this encounter.  Labs/procedures today: none  Plan:  Continue routine obstetrical care  Next visit: prefers in person  for pn2  Reviewed: Preterm labor symptoms and general obstetric precautions including but not limited to vaginal bleeding, contractions, leaking of fluid and fetal movement were reviewed in detail with the patient.  All questions were answered. Does have home bp cuff. Office bp cuff given: not applicable. Check bp weekly, let us know if consistently >140 and/or >90.  Follow-up: Return in about 4 weeks (around 01/23/2021) for LROB, PN2, CNM, in person.  No future appointments.  No orders of the defined types were placed in this encounter.  Cheral Marker CNM, [facility] 12/26/2020 10:43 AM

## 2020-12-26 NOTE — Patient Instructions (Signed)
Wanda Craig, thank you for choosing our office today! We appreciate the opportunity to meet your healthcare needs. You may receive a short survey by mail, e-mail, or through MyChart. If you are happy with your care we would appreciate if you could take just a few minutes to complete the survey questions. We read all of your comments and take your feedback very seriously. Thank you again for choosing our office.  [facility] ([address] [place], [place] [address]) Entrance C, located off of [address] Free 24/7 valet parking   You will have your sugar test next visit.  Please do not eat or drink anything after midnight the night before you come, not even water.  You will be here for at least two hours.  Please make an appointment online for the bloodwork at [email or web address] for 8:00am (or as close to this as possible). Make sure you select the [address] service center.   CLASSES: Go to [email or web address] to register for classes (childbirth, breastfeeding, waterbirth, infant CPR, daddy bootcamp, etc.)  Call the office ([phone]) or go to [facility] if: You begin to have strong, frequent contractions Your water breaks.  Sometimes it is a big gush of fluid, sometimes it is just a trickle that keeps getting your panties wet or running down your legs You have vaginal bleeding.  It is normal to have a small amount of spotting if your cervix was checked.  You don't feel your baby moving like normal.  If you don't, get you something to eat and drink and lay down and focus on feeling your baby move.   If your baby is still not moving like normal, you should call the office or go to [facility].  Call the office ([phone]) or go to [facility] for these signs of pre-eclampsia: Severe headache that does not go away with Tylenol Visual changes- seeing spots, double, blurred vision Pain under your right breast or upper  abdomen that does not go away with Tums or heartburn medicine Nausea and/or vomiting Severe swelling in your hands, feet, and face    [place] Pediatricians/Family Doctors [facility] ([facility]): [address], [phone]           [facility]: [address], [phone]                [facility] ([facility]): [address], [phone] (call to ask if accepting patients) [facility]: [address], [place], [phone]    [place] Pediatricians/Family Doctors [facility] ([facility]): [address], [phone] [facility]: [address], [phone] [facility]: [address], [phone]  [place] [facility]  [facility] ([facility]): [phone] [facility]: [address], [phone]   [place] [facility] Doctors [facility]: [address], [phone]  [place] [facility]  [facility]: [address], [phone]  Home Blood Pressure Monitoring for Patients   Your provider has recommended that you check your blood pressure (BP) at least once a week at home. If you do not have a blood pressure cuff at home, one will be provided for you. Contact your provider if you have not received your monitor within 1 week.   Helpful Tips for Accurate Home Blood Pressure Checks  Don't smoke, exercise, or drink   caffeine 30 minutes before checking your BP Use the restroom before checking your BP (a full bladder can raise your pressure) Relax in a comfortable upright chair Feet on the ground Left arm resting comfortably on a flat surface at the level of your heart Legs uncrossed Back supported Sit quietly and don't talk Place the cuff on your bare arm Adjust snuggly, so that only two fingertips can fit between your skin and the top of the cuff Check 2  readings separated by at least one minute Keep a log of your BP readings For a visual, please reference this diagram: http://ccnc.care/bpdiagram  Provider Name: [facility]     Phone: [phone]  Zone 1: ALL CLEAR  Continue to monitor your symptoms:  BP reading is less than 140 (top number) or less than 90 (bottom number)  No right upper stomach pain No headaches or seeing spots No feeling nauseated or throwing up No swelling in face and hands  Zone 2: CAUTION Call your doctor's office for any of the following:  BP reading is greater than 140 (top number) or greater than 90 (bottom number)  Stomach pain under your ribs in the middle or right side Headaches or seeing spots Feeling nauseated or throwing up Swelling in face and hands  Zone 3: EMERGENCY  Seek immediate medical care if you have any of the following:  BP reading is greater than160 (top number) or greater than 110 (bottom number) Severe headaches not improving with Tylenol Serious difficulty catching your breath Any worsening symptoms from Zone 2   Second Trimester of Pregnancy The second trimester is from week 13 through week 28, months 4 through 6. The second trimester is often a time when you feel your best. Your body has also adjusted to being pregnant, and you begin to feel better physically. Usually, morning sickness has lessened or quit completely, you may have more energy, and you may have an increase in appetite. The second trimester is also a time when the fetus is growing rapidly. At the end of the sixth month, the fetus is about 9 inches long and weighs about 1 pounds. You will likely begin to feel the baby move (quickening) between 18 and 20 weeks of the pregnancy. BODY CHANGES Your body goes through many changes during pregnancy. The changes vary from woman to woman.  Your weight will continue to increase. You will notice your lower abdomen bulging out. You may begin to get stretch marks on your  hips, abdomen, and breasts. You may develop headaches that can be relieved by medicines approved by your health care provider. You may urinate more often because the fetus is pressing on your bladder. You may develop or continue to have heartburn as a result of your pregnancy. You may develop constipation because certain hormones are causing the muscles that push waste through your intestines to slow down. You may develop hemorrhoids or swollen, bulging veins (varicose veins). You may have back pain because of the weight gain and pregnancy hormones relaxing your joints between the bones in your pelvis and as a result of a shift in weight and the muscles that support your balance. Your breasts will continue to grow and be tender. Your gums may bleed and may be sensitive to brushing and flossing. Dark spots or blotches (chloasma, mask of pregnancy) may develop on your face. This will likely fade after the baby is born. A dark line from your belly button to the pubic area (linea nigra) may appear. This   will likely fade after the baby is born. You may have changes in your hair. These can include thickening of your hair, rapid growth, and changes in texture. Some women also have hair loss during or after pregnancy, or hair that feels dry or thin. Your hair will most likely return to normal after your baby is born. WHAT TO EXPECT AT YOUR PRENATAL VISITS During a routine prenatal visit: You will be weighed to make sure you and the fetus are growing normally. Your blood pressure will be taken. Your abdomen will be measured to track your baby's growth. The fetal heartbeat will be listened to. Any test results from the previous visit will be discussed. Your health care provider may ask you: How you are feeling. If you are feeling the baby move. If you have had any abnormal symptoms, such as leaking fluid, bleeding, severe headaches, or abdominal cramping. If you have any questions. Other tests that may  be performed during your second trimester include: Blood tests that check for: Low iron levels (anemia). Gestational diabetes (between 24 and 28 weeks). Rh antibodies. Urine tests to check for infections, diabetes, or protein in the urine. An ultrasound to confirm the proper growth and development of the baby. An amniocentesis to check for possible genetic problems. Fetal screens for spina bifida and Down syndrome. HOME CARE INSTRUCTIONS  Avoid all smoking, herbs, alcohol, and unprescribed drugs. These chemicals affect the formation and growth of the baby. Follow your health care provider's instructions regarding medicine use. There are medicines that are either safe or unsafe to take during pregnancy. Exercise only as directed by your health care provider. Experiencing uterine cramps is a good sign to stop exercising. Continue to eat regular, healthy meals. Wear a good support bra for breast tenderness. Do not use hot tubs, steam rooms, or saunas. Wear your seat belt at all times when driving. Avoid raw meat, uncooked cheese, cat litter boxes, and soil used by cats. These carry germs that can cause birth defects in the baby. Take your prenatal vitamins. Try taking a stool softener (if your health care provider approves) if you develop constipation. Eat more high-fiber foods, such as fresh vegetables or fruit and whole grains. Drink plenty of fluids to keep your urine clear or pale yellow. Take warm sitz baths to soothe any pain or discomfort caused by hemorrhoids. Use hemorrhoid cream if your health care provider approves. If you develop varicose veins, wear support hose. Elevate your feet for 15 minutes, 3-4 times a day. Limit salt in your diet. Avoid heavy lifting, wear low heel shoes, and practice good posture. Rest with your legs elevated if you have leg cramps or low back pain. Visit your dentist if you have not gone yet during your pregnancy. Use a soft toothbrush to brush your teeth  and be gentle when you floss. A sexual relationship may be continued unless your health care provider directs you otherwise. Continue to go to all your prenatal visits as directed by your health care provider. SEEK MEDICAL CARE IF:  You have dizziness. You have mild pelvic cramps, pelvic pressure, or nagging pain in the abdominal area. You have persistent nausea, vomiting, or diarrhea. You have a bad smelling vaginal discharge. You have pain with urination. SEEK IMMEDIATE MEDICAL CARE IF:  You have a fever. You are leaking fluid from your vagina. You have spotting or bleeding from your vagina. You have severe abdominal cramping or pain. You have rapid weight gain or loss. You have shortness of   breath with chest pain. You notice sudden or extreme swelling of your face, hands, ankles, feet, or legs. You have not felt your baby move in over an hour. You have severe headaches that do not go away with medicine. You have vision changes. Document Released: 01/08/2001 Document Revised: 01/19/2013 Document Reviewed: 03/17/2012 ExitCare Patient Information 2015 [employer]. This information is not intended to replace advice given to you by your health care provider. Make sure you discuss any questions you have with your health care provider.  

## 2021-01-10 ENCOUNTER — Other Ambulatory Visit: Payer: Self-pay

## 2021-01-10 ENCOUNTER — Inpatient Hospital Stay (HOSPITAL_COMMUNITY)
Admission: AD | Admit: 2021-01-10 | Discharge: 2021-01-11 | Disposition: A | Discharge status: Home / Self Care | Payer: Medicaid Other | Attending: Obstetrics & Gynecology | Admitting: Obstetrics & Gynecology

## 2021-01-10 ENCOUNTER — Inpatient Hospital Stay (HOSPITAL_COMMUNITY): Payer: Medicaid Other

## 2021-01-10 DIAGNOSIS — O99612 Diseases of the digestive system complicating pregnancy, second trimester: Secondary | ICD-10-CM | POA: Diagnosis not present

## 2021-01-10 DIAGNOSIS — K219 Gastro-esophageal reflux disease without esophagitis: Secondary | ICD-10-CM | POA: Diagnosis not present

## 2021-01-10 DIAGNOSIS — Z3A26 26 weeks gestation of pregnancy: Secondary | ICD-10-CM | POA: Diagnosis not present

## 2021-01-10 DIAGNOSIS — O26612 Liver and biliary tract disorders in pregnancy, second trimester: Secondary | ICD-10-CM | POA: Insufficient documentation

## 2021-01-10 DIAGNOSIS — K8020 Calculus of gallbladder without cholecystitis without obstruction: Secondary | ICD-10-CM | POA: Diagnosis present

## 2021-01-10 DIAGNOSIS — R109 Unspecified abdominal pain: Secondary | ICD-10-CM | POA: Diagnosis present

## 2021-01-10 DIAGNOSIS — O26899 Other specified pregnancy related conditions, unspecified trimester: Secondary | ICD-10-CM

## 2021-01-10 DIAGNOSIS — Z7982 Long term (current) use of aspirin: Secondary | ICD-10-CM | POA: Insufficient documentation

## 2021-01-10 DIAGNOSIS — Z6791 Unspecified blood type, Rh negative: Secondary | ICD-10-CM

## 2021-01-10 DIAGNOSIS — R1011 Right upper quadrant pain: Secondary | ICD-10-CM

## 2021-01-10 LAB — COMPREHENSIVE METABOLIC PANEL
ALT: 20 U/L (ref 0–44)
AST: 21 U/L (ref 15–41)
Albumin: 3.5 g/dL (ref 3.5–5.0)
Alkaline Phosphatase: 111 U/L (ref 38–126)
Anion gap: 12 (ref 5–15)
BUN: 8 mg/dL (ref 6–20)
CO2: 19 mmol/L — ABNORMAL LOW (ref 22–32)
Calcium: 9 mg/dL (ref 8.9–10.3)
Chloride: 103 mmol/L (ref 98–111)
Creatinine, Ser: 0.54 mg/dL (ref 0.44–1.00)
GFR, Estimated: >60 mL/min (ref >60)
Glucose, Bld: 113 mg/dL — ABNORMAL HIGH (ref 70–99)
Potassium: 3.7 mmol/L (ref 3.5–5.1)
Sodium: 134 mmol/L — ABNORMAL LOW (ref 135–145)
Total Bilirubin: 0.4 mg/dL (ref 0.3–1.2)
Total Protein: 7.1 g/dL (ref 6.5–8.1)

## 2021-01-10 LAB — CBC WITH DIFFERENTIAL/PLATELET
Abs Immature Granulocytes: 0.11 10*3/uL — ABNORMAL HIGH (ref 0.00–0.07)
Basophils Absolute: 0 10*3/uL (ref 0.0–0.1)
Basophils Relative: 0 %
Eosinophils Absolute: 0 10*3/uL (ref 0.0–0.5)
Eosinophils Relative: 0 %
HCT: 36.9 % (ref 36.0–46.0)
Hemoglobin: 12.2 g/dL (ref 12.0–15.0)
Immature Granulocytes: 1 %
Lymphocytes Relative: 11 %
Lymphs Abs: 1.7 10*3/uL (ref 0.7–4.0)
MCH: 30.5 pg (ref 26.0–34.0)
MCHC: 33.1 g/dL (ref 30.0–36.0)
MCV: 92.3 fL (ref 80.0–100.0)
Monocytes Absolute: 0.6 10*3/uL (ref 0.1–1.0)
Monocytes Relative: 4 %
Neutro Abs: 13.5 10*3/uL — ABNORMAL HIGH (ref 1.7–7.7)
Neutrophils Relative %: 84 %
Platelets: 382 10*3/uL (ref 150–400)
RBC: 4 MIL/uL (ref 3.87–5.11)
RDW: 12.4 % (ref 11.5–15.5)
WBC: 16 10*3/uL — ABNORMAL HIGH (ref 4.0–10.5)
nRBC: 0 % (ref 0.0–0.2)

## 2021-01-10 LAB — URINALYSIS, ROUTINE W REFLEX MICROSCOPIC
Bilirubin Urine: NEGATIVE
Glucose, UA: NEGATIVE mg/dL
Hgb urine dipstick: NEGATIVE
Ketones, ur: >80 mg/dL — AB
Leukocytes,Ua: NEGATIVE
Nitrite: NEGATIVE
Protein, ur: NEGATIVE mg/dL
Specific Gravity, Urine: >1.03 — ABNORMAL HIGH (ref 1.005–1.030)
pH: 6 (ref 5.0–8.0)

## 2021-01-10 LAB — AMYLASE: Amylase: 73 U/L (ref 28–100)

## 2021-01-10 LAB — LIPASE, BLOOD: Lipase: 27 U/L (ref 11–51)

## 2021-01-10 MED ORDER — HYDROMORPHONE HCL 1 MG/ML IJ SOLN
0.5000 mg | Freq: Once | INTRAMUSCULAR | Status: AC
  Administered 2021-01-10: 23:00:00 0.5 mg via INTRAVENOUS
  Filled 2021-01-10: qty 1

## 2021-01-10 MED ORDER — LACTATED RINGERS IV SOLN: INTRAVENOUS | Status: DC

## 2021-01-10 MED ORDER — HYDROMORPHONE HCL 1 MG/ML IJ SOLN
0.5000 mg | Freq: Once | INTRAMUSCULAR | Status: AC
  Administered 2021-01-10: 0.5 mg via INTRAVENOUS
  Filled 2021-01-10: qty 1

## 2021-01-10 MED ORDER — ONDANSETRON HCL 4 MG/2ML IJ SOLN
4.0000 mg | Freq: Once | INTRAMUSCULAR | Status: AC
  Administered 2021-01-10: 23:00:00 4 mg via INTRAVENOUS
  Filled 2021-01-10: qty 2

## 2021-01-10 MED ORDER — HYOSCYAMINE SULFATE 0.125 MG SL SUBL
0.1250 mg | SUBLINGUAL_TABLET | Freq: Once | SUBLINGUAL | Status: AC
  Administered 2021-01-10: 22:00:00 0.125 mg via SUBLINGUAL
  Filled 2021-01-10: qty 1

## 2021-01-10 MED ORDER — HYDROMORPHONE HCL 1 MG/ML IJ SOLN
0.5000 mg | Freq: Once | INTRAMUSCULAR | Status: DC
Start: 2021-01-10 — End: 2021-01-10

## 2021-01-10 NOTE — MAU Provider Note (Signed)
Chief Complaint:  Abdominal Pain and Back Pain   Event Date/Time   First Provider Initiated Contact with Patient 01/10/21 2125     HPI: Wanda Craig is a 25 y.o. G1P0 at 1w3dwho presents to maternity admissions reporting severe abdominal pain.  States it hurts all over abdomen, but especially epigastric and RUQ. Marland Kitchen She reports good fetal movement, denies LOF, vaginal bleeding, vaginal itching/burning, urinary symptoms, h/a, dizziness, diarrhea, constipation or fever/chills.  .  Abdominal Pain This is a new problem. The current episode started today. The problem occurs constantly. The pain is located in the epigastric region, RUQ and generalized abdominal region. The pain is severe. The quality of the pain is cramping and sharp. Associated symptoms include nausea. Pertinent negatives include no constipation, diarrhea, dysuria, fever or frequency. The pain is aggravated by palpation and certain positions. The pain is relieved by Nothing. She has tried nothing for the symptoms.  Back Pain This is a new problem. The current episode started today. The problem occurs constantly. Stiffness is present All day. Associated symptoms include abdominal pain. Pertinent negatives include no dysuria or fever. She has tried nothing for the symptoms.   RN note: Pain in abdomen and back for hour and half. Sharp pain that is constant. Denies VB or LOF. "Everything hurts". Pt was at [facility] when pain started. Last ate 1500  Past Medical History: Past Medical History:  Diagnosis Date   Medical history non-contributory     Past obstetric history: OB History  Gravida Para Term Preterm AB Living  1            SAB IAB Ectopic Multiple Live Births               # Outcome Date GA Lbr Len/2nd Weight Sex Delivery Anes PTL Lv  1 Current             Past Surgical History: Past Surgical History:  Procedure Laterality Date   NO PAST SURGERIES      Family History: Family History  Problem Relation Age of Onset    Arthritis Mother    Breast cancer Maternal Aunt    Diabetes Paternal Uncle    Dementia Maternal Grandmother    Mental illness Paternal Grandmother    Schizophrenia Paternal Grandmother     Social History: Social History   Tobacco Use   Smoking status: Never   Smokeless tobacco: Never  Vaping Use   Vaping Use: Never used  Substance Use Topics   Alcohol use: Not Currently   Drug use: No    Allergies:  Allergies  Allergen Reactions   Other Other (See Comments)    Pt requests no narcotic orders.  States she has no substance issues; she states she prefers to be cognizant of whether or not she is getting better and does not want her pain to be "muted."     Meds:  Medications Prior to Admission  Medication Sig Dispense Refill Last Dose   aspirin 81 MG chewable tablet Chew 2 tablets (162 mg total) by mouth daily. 60 tablet 7    Blood Pressure Monitor MISC For regular home bp monitoring during pregnancy 1 each 0    fluticasone (FLONASE) 50 MCG/ACT nasal spray Place 2 sprays into both nostrils daily. 16 g 12    Prenatal Vit-Fe Fumarate-FA (MULTIVITAMIN-PRENATAL) 27-0.8 MG TABS tablet Take 1 tablet by mouth daily at 12 noon.      promethazine (PHENERGAN) 25 MG tablet Take 0.5-1 tablets (12.5-25 mg total) by  mouth every 6 (six) hours as needed for nausea or vomiting. (Patient not taking: Reported on 11/01/2020) 30 tablet 0     I have reviewed patient's Past Medical Hx, Surgical Hx, Family Hx, Social Hx, medications and allergies.   ROS:  Review of Systems  Constitutional:  Negative for fever.  Gastrointestinal:  Positive for abdominal pain and nausea. Negative for constipation and diarrhea.  Genitourinary:  Negative for dysuria and frequency.  Musculoskeletal:  Positive for back pain.  Other systems negative  Physical Exam  Patient Vitals for the past 24 hrs:  BP Temp Pulse Resp SpO2 Height Weight  01/10/21 2046 112/70 (!) 97.4 F (36.3 C) -- -- -- -- --  01/10/21 2045 --  -- 82 18 100 % 5\' 3"  (1.6 m) 98.4 kg   Constitutional: Well-developed, well-nourished female in no acute distress.  Cardiovascular: normal rate and rhythm Respiratory: normal effort GI: Abd soft, tender, mostly over upper abdomen.  Abdomen is gravid appropriate for gestational age.   No rebound or guarding. MS: Extremities nontender, no edema, normal ROM Neurologic: Alert and oriented x 4.  GU: Neg CVAT.   FHT:  Baseline 130 , moderate variability, accelerations present, no decelerations Contractions:  Rare   Labs: Results for orders placed or performed during the hospital encounter of 01/10/21 (from the past 24 hour(s))  Urinalysis, Routine w reflex microscopic Urine, Clean Catch     Status: Abnormal   Collection Time: 01/10/21  8:49 PM  Result Value Ref Range   Color, Urine YELLOW YELLOW   APPearance CLOUDY (A) CLEAR   Specific Gravity, Urine >1.030 (H) 1.005 - 1.030   pH 6.0 5.0 - 8.0   Glucose, UA NEGATIVE NEGATIVE mg/dL   Hgb urine dipstick NEGATIVE NEGATIVE   Bilirubin Urine NEGATIVE NEGATIVE   Ketones, ur >80 (A) NEGATIVE mg/dL   Protein, ur NEGATIVE NEGATIVE mg/dL   Nitrite NEGATIVE NEGATIVE   Leukocytes,Ua NEGATIVE NEGATIVE  CBC with Differential/Platelet     Status: Abnormal   Collection Time: 01/10/21  9:43 PM  Result Value Ref Range   WBC 16.0 (H) 4.0 - 10.5 K/uL   RBC 4.00 3.87 - 5.11 MIL/uL   Hemoglobin 12.2 12.0 - 15.0 g/dL   HCT 01/12/21 25.4 - 27.0 %   MCV 92.3 80.0 - 100.0 fL   MCH 30.5 26.0 - 34.0 pg   MCHC 33.1 30.0 - 36.0 g/dL   RDW 62.3 76.2 - 83.1 %   Platelets 382 150 - 400 K/uL   nRBC 0.0 0.0 - 0.2 %   Neutrophils Relative % 84 %   Neutro Abs 13.5 (H) 1.7 - 7.7 K/uL   Lymphocytes Relative 11 %   Lymphs Abs 1.7 0.7 - 4.0 K/uL   Monocytes Relative 4 %   Monocytes Absolute 0.6 0.1 - 1.0 K/uL   Eosinophils Relative 0 %   Eosinophils Absolute 0.0 0.0 - 0.5 K/uL   Basophils Relative 0 %   Basophils Absolute 0.0 0.0 - 0.1 K/uL   Immature Granulocytes  1 %   Abs Immature Granulocytes 0.11 (H) 0.00 - 0.07 K/uL  Comprehensive metabolic panel     Status: Abnormal   Collection Time: 01/10/21  9:43 PM  Result Value Ref Range   Sodium 134 (L) 135 - 145 mmol/L   Potassium 3.7 3.5 - 5.1 mmol/L   Chloride 103 98 - 111 mmol/L   CO2 19 (L) 22 - 32 mmol/L   Glucose, Bld 113 (H) 70 - 99 mg/dL   BUN  8 6 - 20 mg/dL   Creatinine, Ser 4.53 0.44 - 1.00 mg/dL   Calcium 9.0 8.9 - 64.6 mg/dL   Total Protein 7.1 6.5 - 8.1 g/dL   Albumin 3.5 3.5 - 5.0 g/dL   AST 21 15 - 41 U/L   ALT 20 0 - 44 U/L   Alkaline Phosphatase 111 38 - 126 U/L   Total Bilirubin 0.4 0.3 - 1.2 mg/dL   GFR, Estimated >80 >32 mL/min   Anion gap 12 5 - 15  Lipase, blood     Status: None   Collection Time: 01/10/21  9:43 PM  Result Value Ref Range   Lipase 27 11 - 51 U/L  Amylase     Status: None   Collection Time: 01/10/21  9:43 PM  Result Value Ref Range   Amylase 73 28 - 100 U/L    O/Negative/-- (09/07 1126)  Imaging:  US Abdomen Complete  Result Date: 01/10/2021 CLINICAL DATA:  Abdominal pain. EXAM: ABDOMEN ULTRASOUND COMPLETE COMPARISON:  Ultrasound dated 11/29/2015. FINDINGS: Gallbladder: Multiple small gallstones. No gallbladder wall thickening or pericholecystic fluid. Negative sonographic Murphy's sign. Common bile duct: Diameter: 2 mm Liver: No focal lesion identified. Within normal limits in parenchymal echogenicity. Portal vein is patent on color Doppler imaging with normal direction of blood flow towards the liver. IVC: No abnormality visualized. Pancreas: Visualized portion unremarkable. Spleen: Size and appearance within normal limits. Right Kidney: Length: 10.8 cm. Echogenicity within normal limits. No mass or hydronephrosis visualized. Left Kidney: Length: 11.6 cm. Echogenicity within normal limits. No mass or hydronephrosis visualized. Abdominal aorta: No aneurysm visualized. Other findings: None. IMPRESSION: Cholelithiasis without sonographic evidence of acute  cholecystitis. Electronically Signed   By: Elgie Collard M.D.   On: 01/10/2021 22:46    MAU Course/MDM: I have ordered labs and reviewed results. There is mild leukocytosis but Transaminases are normal  US shows cholelithiasis, but no cholecystitis or obstruction.  NST reviewed, reassuring Consult Dr Debroah Loop with presentation, exam findings and test results.  Treatments in MAU included Dilaudid, zofran, Levsin, simethicone, and GI cocktail    The pain did improve, especially after  GI cocktail   Reviewed findings.  Reviewed warning signs for cholecystitis.  Discussed there is also some component of gastric irritation due to acid reflux. Recommend surgical consult. WIll start on protonix also.   Assessment: Single IUP at [redacted]w[redacted]d Cholelithiasis Acid Reflux  Plan: Discharge home Rx Tramadol for pain  Rx Zofran for nausea Rx Protonix for reflux Preterm Labor precautions and fetal kick counts Follow up in Office for prenatal visits  Encouraged to return if she develops worsening of symptoms, increase in pain, fever, or other concerning symptoms.  Pt stable at time of discharge.  Wynelle Bourgeois CNM, MSN Certified Nurse-Midwife 01/10/2021 9:25 PM

## 2021-01-10 NOTE — MAU Note (Addendum)
Pain in abdomen and back for hour and half. Sharp pain that is constant. Denies VB or LOF. "Everything hurts". Pt was at [facility] when pain started. Last ate 1500

## 2021-01-11 ENCOUNTER — Encounter: Payer: Self-pay | Admitting: Women's Health

## 2021-01-11 DIAGNOSIS — K8020 Calculus of gallbladder without cholecystitis without obstruction: Secondary | ICD-10-CM | POA: Diagnosis present

## 2021-01-11 DIAGNOSIS — Z3A26 26 weeks gestation of pregnancy: Secondary | ICD-10-CM

## 2021-01-11 MED ORDER — TRAMADOL HCL 50 MG PO TABS: 50.0000 mg | ORAL_TABLET | Freq: Four times a day (QID) | ORAL | 0 refills | Status: DC | PRN

## 2021-01-11 MED ORDER — PANTOPRAZOLE SODIUM 20 MG PO TBEC: 20.0000 mg | DELAYED_RELEASE_TABLET | Freq: Every day | ORAL | 1 refills | Status: DC

## 2021-01-11 MED ORDER — ONDANSETRON 4 MG PO TBDP: 4.0000 mg | ORAL_TABLET | Freq: Four times a day (QID) | ORAL | 0 refills | Status: DC | PRN

## 2021-01-11 MED ORDER — LIDOCAINE VISCOUS HCL 2 % MT SOLN
15.0000 mL | Freq: Once | OROMUCOSAL | Status: AC
  Administered 2021-01-11: 15 mL via ORAL
  Filled 2021-01-11: qty 15

## 2021-01-11 MED ORDER — ALUM & MAG HYDROXIDE-SIMETH 200-200-20 MG/5ML PO SUSP
30.0000 mL | Freq: Once | ORAL | Status: DC
  Filled 2021-01-11: qty 30

## 2021-01-11 MED ORDER — ALUM & MAG HYDROXIDE-SIMETH 200-200-20 MG/5ML PO SUSP
30.0000 mL | Freq: Once | ORAL | Status: AC
  Administered 2021-01-11: 30 mL via ORAL
  Filled 2021-01-11: qty 30

## 2021-01-12 ENCOUNTER — Other Ambulatory Visit: Payer: Self-pay | Admitting: Obstetrics & Gynecology

## 2021-01-12 DIAGNOSIS — K8020 Calculus of gallbladder without cholecystitis without obstruction: Secondary | ICD-10-CM

## 2021-01-12 DIAGNOSIS — Z3402 Encounter for supervision of normal first pregnancy, second trimester: Secondary | ICD-10-CM

## 2021-01-12 MED ORDER — ACCU-CHEK GUIDE ME W/DEVICE KIT: 1.0000 | PACK | Freq: Four times a day (QID) | 0 refills | Status: DC

## 2021-01-12 MED ORDER — ACCU-CHEK SOFTCLIX LANCETS MISC
12 refills | Status: DC
Start: 2021-01-12 — End: 2021-04-03

## 2021-01-12 MED ORDER — ACCU-CHEK GUIDE VI STRP: ORAL_STRIP | 12 refills | Status: DC

## 2021-01-23 ENCOUNTER — Ambulatory Visit (INDEPENDENT_AMBULATORY_CARE_PROVIDER_SITE_OTHER): Payer: Medicaid Other | Admitting: Women's Health

## 2021-01-23 ENCOUNTER — Other Ambulatory Visit: Payer: Self-pay | Admitting: Women's Health

## 2021-01-23 ENCOUNTER — Encounter: Payer: Self-pay | Admitting: Women's Health

## 2021-01-23 ENCOUNTER — Other Ambulatory Visit: Payer: Self-pay

## 2021-01-23 ENCOUNTER — Other Ambulatory Visit: Payer: Medicaid Other

## 2021-01-23 VITALS — BP 127/75 | HR 98 | Wt 211.5 lb

## 2021-01-23 DIAGNOSIS — Z3403 Encounter for supervision of normal first pregnancy, third trimester: Secondary | ICD-10-CM

## 2021-01-23 DIAGNOSIS — K8020 Calculus of gallbladder without cholecystitis without obstruction: Secondary | ICD-10-CM

## 2021-01-23 DIAGNOSIS — O26613 Liver and biliary tract disorders in pregnancy, third trimester: Secondary | ICD-10-CM

## 2021-01-23 DIAGNOSIS — Z131 Encounter for screening for diabetes mellitus: Secondary | ICD-10-CM

## 2021-01-23 DIAGNOSIS — Z3483 Encounter for supervision of other normal pregnancy, third trimester: Secondary | ICD-10-CM

## 2021-01-23 DIAGNOSIS — Z3A28 28 weeks gestation of pregnancy: Secondary | ICD-10-CM

## 2021-01-23 NOTE — Progress Notes (Signed)
LOW-RISK PREGNANCY VISIT Patient name: Wanda Craig MRN [id number]  Date of birth: November 02, 1995 Chief Complaint:   Routine Prenatal Visit  History of Present Illness:   Wanda Craig is a 25 y.o. G1P0 female at [redacted]w[redacted]d with an Estimated Date of Delivery: 04/15/21 being seen today for ongoing management of a low-risk pregnancy.   Today she reports  went to MAU 12/14 w/ severe RUQ pain, dx w/ gallstones. Had another attack couple days ago. Did not want to do GTT as she read excessive sugar can exacerbate sx, so got supplies to check sugars QID x2wks, hasn't started yet, brought supplies w/ her today. Has been eating strict low fat diet  . Contractions: Not present. Vag. Bleeding: None.  Movement: Present. denies leaking of fluid.  Depression screen [facility] 2/9 10/04/2020 08/29/2020  Decreased Interest 0 0  Down, Depressed, Hopeless 0 0  PHQ - 2 Score 0 0  Altered sleeping 0 -  Tired, decreased energy 1 -  Change in appetite 0 -  Feeling bad or failure about yourself  0 -  Trouble concentrating 0 -  Moving slowly or fidgety/restless 0 -  Suicidal thoughts 0 -  PHQ-9 Score 1 -     GAD 7 : Generalized Anxiety Score 10/04/2020  Nervous, Anxious, on Edge 0  Control/stop worrying 0  Worry too much - different things 0  Trouble relaxing 0  Restless 0  Easily annoyed or irritable 0  Afraid - awful might happen 0  Total GAD 7 Score 0      Review of Systems:   Pertinent items are noted in HPI Denies abnormal vaginal discharge w/ itching/odor/irritation, headaches, visual changes, shortness of breath, chest pain, abdominal pain, severe nausea/vomiting, or problems with urination or bowel movements unless otherwise stated above. Pertinent History Reviewed:  Reviewed past medical,surgical, social, obstetrical and family history.  Reviewed problem list, medications and allergies. Physical Assessment:   Vitals:   01/23/21 0901  BP: 127/75  Pulse: 98  Weight: 211 lb 8 oz (95.9 kg)  Body mass  index is 37.47 kg/m.        Physical Examination:   General appearance: Well appearing, and in no distress  Mental status: Alert, oriented to person, place, and time  Skin: Warm & dry  Cardiovascular: Normal heart rate noted  Respiratory: Normal respiratory effort, no distress  Abdomen: Soft, gravid, nontender  Pelvic: Cervical exam deferred         Extremities: Edema: None  Fetal Status: Fetal Heart Rate (bpm): 145 Fundal Height: 28 cm Movement: Present    Chaperone: N/A   No results found for this or any previous visit (from the past 24 hour(s)).  Assessment & Plan:  1) Low-risk pregnancy G1P0 at [redacted]w[redacted]d with an Estimated Date of Delivery: 04/15/21   2) Symptomatic cholelithiasis, referral to general surgery ordered, note routed to [facility]. Continue low fat/spicy/greasy diet  3) Screening gestational diabetes> check sugars QID x 2wks, keep log and bring to appt in 2wks (took to [facility] to show her how to use supplies)   Meds: No orders of the defined types were placed in this encounter.  Labs/procedures today: declined flu shot and declined tdap, pn2 (minus GTT)  Plan:  Continue routine obstetrical care  Next visit: prefers in person    Reviewed: Preterm labor symptoms and general obstetric precautions including but not limited to vaginal bleeding, contractions, leaking of fluid and fetal movement were reviewed in detail with the patient.  All questions were  answered. Does have home bp cuff. Office bp cuff given: not applicable. Check bp weekly, let us know if consistently >140 and/or >90.  Follow-up: Return in about 2 weeks (around 02/06/2021) for LROB, CNM, in person.  No future appointments.  Orders Placed This Encounter  Procedures   Ambulatory referral to General Surgery   Cheral Marker CNM, [facility] 01/23/2021 9:32 AM

## 2021-01-23 NOTE — Patient Instructions (Addendum)
Wanda Craig, thank you for choosing our office today! We appreciate the opportunity to meet your healthcare needs. You may receive a short survey by mail, e-mail, or through [employer]. If you are happy with your care we would appreciate if you could take just a few minutes to complete the survey questions. We read all of your comments and take your feedback very seriously. Thank you again for choosing our office.  Center for [employer] Team at [facility]  [facility] ([address] [place], [place] [address]) Entrance C, located off of [address] valet parking   Check blood sugars 4 times a day: in the morning before eating/drinking anything (goal is <95) and 2 hours after your first bite of breakfast, lunch, and supper (goal is <120). Please keep a log (Example 06/26/20: 95, 120, 120, 120) and bring to each appointment.   CLASSES: Go to [email or web address] to register for classes (childbirth, breastfeeding, waterbirth, infant CPR, daddy bootcamp, etc.)  Call the office [phone]) or go to [facility] if: You begin to have strong, frequent contractions Your water breaks.  Sometimes it is a big gush of fluid, sometimes it is just a trickle that keeps getting your panties wet or running down your legs You have vaginal bleeding.  It is normal to have a small amount of spotting if your cervix was checked.  You don't feel your baby moving like normal.  If you don't, get you something to eat and drink and lay down and focus on feeling your baby move.   If your baby is still not moving like normal, you should call the office or go to [facility].  Call the office [phone]) or go to [facility] hospital for these signs of pre-eclampsia: Severe headache that does not go away with Tylenol Visual changes- seeing spots, double, blurred vision Pain under your right breast or upper abdomen that does not go away with Tums or heartburn medicine Nausea and/or  vomiting Severe swelling in your hands, feet, and face   Tdap Vaccine It is recommended that you get the Tdap vaccine during the third trimester of EACH pregnancy to help protect your baby from getting pertussis (whooping cough) 27-36 weeks is the BEST time to do this so that you can pass the protection on to your baby. During pregnancy is better than after pregnancy, but if you are unable to get it during pregnancy it will be offered at the hospital.  You can get this vaccine with [place], at the health department, your family doctor, or some local pharmacies Everyone who will be around your baby should also be up-to-date on their vaccines before the baby comes. Adults (who are not pregnant) only need 1 dose of Tdap during adulthood.   [facility] Pediatricians/Family Doctors [facility]): [address] Colette Ribas, [phone]           [facility]: [address], [phone]                [facility]): [address], [phone] (call to ask if accepting patients) [facility] Department: [address], [place], [phone]    [facility] Pediatricians/Family Doctors [facility]): [phone] [address], [phone] [facility]: [address] [place], [phone] [facility]: [address], [phone]  [facility] Doctors  [facility]): [phone] [facility]:  [address], [phone]   [facility] Doctors [facility]: [address], [phone]  [facility] Doctors  [facility]: [phone], [phone]  Home Blood Pressure Monitoring for Patients   Your provider has recommended that you check your blood pressure (BP) at least once a week at home. If you do not have a blood pressure cuff at home, one will be provided for you. Contact your  provider if you have not received your monitor within 1 week.   Helpful Tips for Accurate Home Blood Pressure Checks  Don't smoke, exercise, or drink caffeine 30 minutes before checking your BP Use the restroom before checking your BP (a full bladder can raise your pressure) Relax in a comfortable upright chair Feet on the ground Left arm resting comfortably on a flat surface at the level of your heart Legs uncrossed Back supported Sit quietly and don't talk Place the cuff on your bare arm Adjust snuggly, so that only two fingertips can fit between your skin and the top of the cuff Check 2 readings separated by at least one minute Keep a log of your BP readings For a visual, please reference this diagram: http://ccnc.care/bpdiagram  Provider Name: [facility]     Phone: [phone]  Zone 1: ALL CLEAR  Continue to monitor your symptoms:  BP reading is less than 140 (top number) or less than 90 (bottom number)  No right upper stomach pain No headaches or seeing spots No feeling nauseated or throwing up No swelling in face and hands  Zone 2: CAUTION Call your doctor's office for any of the following:  BP reading is greater than 140 (top number) or greater than 90 (bottom number)  Stomach pain under your ribs in the middle or right side Headaches or seeing spots Feeling nauseated or throwing up Swelling in face and hands  Zone 3: EMERGENCY  Seek immediate medical care if you have any of the following:  BP reading is greater than160 (top number) or greater than 110 (bottom number) Severe headaches not improving with Tylenol Serious difficulty catching your breath Any worsening symptoms from Zone 2   Third Trimester of Pregnancy The third trimester is from week 29 through week 42, months 7 through 9. The third trimester is a time when the fetus is growing rapidly. At the end of the ninth month, the fetus is about 20 inches in length and weighs 6-10 pounds.  BODY  CHANGES Your body goes through many changes during pregnancy. The changes vary from woman to woman.  Your weight will continue to increase. You can expect to gain 25-35 pounds (11-16 kg) by the end of the pregnancy. You may begin to get stretch marks on your hips, abdomen, and breasts. You may urinate more often because the fetus is moving lower into your pelvis and pressing on your bladder. You may develop or continue to have heartburn as a result of your pregnancy. You may develop constipation because certain hormones are causing the muscles that push waste through your intestines to slow down. You may develop hemorrhoids or swollen, bulging veins (varicose veins). You may have pelvic pain because of the weight gain and pregnancy hormones relaxing your joints between the bones in your pelvis. Backaches may result from overexertion of the muscles supporting your posture. You may have changes in your hair. These can include thickening of your hair, rapid growth, and changes in texture. Some women also have hair loss during or after  pregnancy, or hair that feels dry or thin. Your hair will most likely return to normal after your baby is born. Your breasts will continue to grow and be tender. A yellow discharge may leak from your breasts called colostrum. Your belly button may stick out. You may feel short of breath because of your expanding uterus. You may notice the fetus "dropping," or moving lower in your abdomen. You may have a bloody mucus discharge. This usually occurs a few days to a week before labor begins. Your cervix becomes thin and soft (effaced) near your due date. WHAT TO EXPECT AT YOUR PRENATAL EXAMS  You will have prenatal exams every 2 weeks until week 36. Then, you will have weekly prenatal exams. During a routine prenatal visit: You will be weighed to make sure you and the fetus are growing normally. Your blood pressure is taken. Your abdomen will be measured to track your  baby's growth. The fetal heartbeat will be listened to. Any test results from the previous visit will be discussed. You may have a cervical check near your due date to see if you have effaced. At around 36 weeks, your caregiver will check your cervix. At the same time, your caregiver will also perform a test on the secretions of the vaginal tissue. This test is to determine if a type of bacteria, Group B streptococcus, is present. Your caregiver will explain this further. Your caregiver may ask you: What your birth plan is. How you are feeling. If you are feeling the baby move. If you have had any abnormal symptoms, such as leaking fluid, bleeding, severe headaches, or abdominal cramping. If you have any questions. Other tests or screenings that may be performed during your third trimester include: Blood tests that check for low iron levels (anemia). Fetal testing to check the health, activity level, and growth of the fetus. Testing is done if you have certain medical conditions or if there are problems during the pregnancy. FALSE LABOR You may feel small, irregular contractions that eventually go away. These are called Braxton Hicks contractions, or false labor. Contractions may last for hours, days, or even weeks before true labor sets in. If contractions come at regular intervals, intensify, or become painful, it is best to be seen by your caregiver.  SIGNS OF LABOR  Menstrual-like cramps. Contractions that are 5 minutes apart or less. Contractions that start on the top of the uterus and spread down to the lower abdomen and back. A sense of increased pelvic pressure or back pain. A watery or bloody mucus discharge that comes from the vagina. If you have any of these signs before the 37th week of pregnancy, call your caregiver right away. You need to go to the hospital to get checked immediately. HOME CARE INSTRUCTIONS  Avoid all smoking, herbs, alcohol, and unprescribed drugs. These  chemicals affect the formation and growth of the baby. Follow your caregiver's instructions regarding medicine use. There are medicines that are either safe or unsafe to take during pregnancy. Exercise only as directed by your caregiver. Experiencing uterine cramps is a good sign to stop exercising. Continue to eat regular, healthy meals. Wear a good support bra for breast tenderness. Do not use hot tubs, steam rooms, or saunas. Wear your seat belt at all times when driving. Avoid raw meat, uncooked cheese, cat litter boxes, and soil used by cats. These carry germs that can cause birth defects in the baby. Take your prenatal vitamins. Try taking a stool softener (if your  caregiver approves) if you develop constipation. Eat more high-fiber foods, such as fresh vegetables or fruit and whole grains. Drink plenty of fluids to keep your urine clear or pale yellow. Take warm sitz baths to soothe any pain or discomfort caused by hemorrhoids. Use hemorrhoid cream if your caregiver approves. If you develop varicose veins, wear support hose. Elevate your feet for 15 minutes, 3-4 times a day. Limit salt in your diet. Avoid heavy lifting, wear low heal shoes, and practice good posture. Rest a lot with your legs elevated if you have leg cramps or low back pain. Visit your dentist if you have not gone during your pregnancy. Use a soft toothbrush to brush your teeth and be gentle when you floss. A sexual relationship may be continued unless your caregiver directs you otherwise. Do not travel far distances unless it is absolutely necessary and only with the approval of your caregiver. Take prenatal classes to understand, practice, and ask questions about the labor and delivery. Make a trial run to the hospital. Pack your hospital bag. Prepare the baby's nursery. Continue to go to all your prenatal visits as directed by your caregiver. SEEK MEDICAL CARE IF: You are unsure if you are in labor or if your water  has broken. You have dizziness. You have mild pelvic cramps, pelvic pressure, or nagging pain in your abdominal area. You have persistent nausea, vomiting, or diarrhea. You have a bad smelling vaginal discharge. You have pain with urination. SEEK IMMEDIATE MEDICAL CARE IF:  You have a fever. You are leaking fluid from your vagina. You have spotting or bleeding from your vagina. You have severe abdominal cramping or pain. You have rapid weight loss or gain. You have shortness of breath with chest pain. You notice sudden or extreme swelling of your face, hands, ankles, feet, or legs. You have not felt your baby move in over an hour. You have severe headaches that do not go away with medicine. You have vision changes. Document Released: 01/08/2001 Document Revised: 01/19/2013 Document Reviewed: 03/17/2012 [facility] Patient Information 2015 [place], [place]. This information is not intended to replace advice given to you by your health care provider. Make sure you discuss any questions you have with your health care provider.

## 2021-01-24 LAB — CBC
Hematocrit: 31.9 % — ABNORMAL LOW (ref 34.0–46.6)
Hemoglobin: 11 g/dL — ABNORMAL LOW (ref 11.1–15.9)
MCH: 31 pg (ref 26.6–33.0)
MCHC: 34.5 g/dL (ref 31.5–35.7)
MCV: 90 fL (ref 79–97)
Platelets: 362 10*3/uL (ref 150–450)
RBC: 3.55 x10E6/uL — ABNORMAL LOW (ref 3.77–5.28)
RDW: 11.7 % (ref 11.7–15.4)
WBC: 7.3 10*3/uL (ref 3.4–10.8)

## 2021-01-24 LAB — RPR: RPR Ser Ql: NONREACTIVE

## 2021-01-24 LAB — ANTIBODY SCREEN: Antibody Screen: NEGATIVE

## 2021-01-24 LAB — HIV ANTIBODY (ROUTINE TESTING W REFLEX): HIV Screen 4th Generation wRfx: NONREACTIVE

## 2021-01-28 NOTE — L&D Delivery Note (Addendum)
OB/GYN Faculty Practice Delivery Note ?Wanda Craig is a 26 y.o. G1P0 s/p SVD at [redacted]w[redacted]d. She was admitted for IOL d/t gHTN and A2DM.  ? ?ROM: 28h 52m with clear fluid ?GBS Status: Negative ?Maximum Maternal Temperature: 98.9 ? ?Labor Progress: ?Pt admitted for IOL s/p cytotec, FB, and pitocin for augmentation with one pitocin break. Progressed to complete with no urge to push, labored down with good fetal descent. Resumed pushing with good progress.  ? ?Delivery Date/Time: 04/01/2021 @ 1649 ? ?Delivery: Called to room and patient was complete and pushing. Fetal bradycardia noted with crowning. Dr. Para March called to bedside. Head delivered ROA over intact perineum. Loose nuchal cord present x 1. Shoulder and body delivered through nuchal. Infant with no respiratory rate, heart rate at 60, placed on mother's abdomen, dried and stimulated, cord clamped x 2 quickly, and cut by me. NICU called to bedside. Cord gases and cord blood drawn. Placenta delivered spontaneously with gentle cord traction. Fundus firm with massage and Pitocin. TXA administered given prolonged second stage labor. Labia, perineum, vagina, and cervix inspected inspected with no lacerations.  ? ?Placenta: 3 VC, intact and to L&D ?Complications: Fetal bradycardia at delivery with crowning, NICU and Dr. Para March at bedside. See NICU notes. ?Lacerations: None ?EBL: 100 ml ?Analgesia: Epidural  ? ?Postpartum Planning ?[ ]  message to sent to schedule follow-up  ?[ ]  vaccines UTD ? ?Infant: Female  APGARs 2/6/9 3130 g ? ?Admit Mom to postpartum.  Baby to Couplet care / Skin to Skin. ? ?I was present and gloved with the SNM for the entire delivery. I agree with the note above.  ? ? DNP, CNM  ?04/01/21  7:37 PM  ? ? ? ? ? ? ? ? ? ? ?

## 2021-02-09 ENCOUNTER — Encounter: Payer: Self-pay | Admitting: Obstetrics & Gynecology

## 2021-02-09 ENCOUNTER — Ambulatory Visit (INDEPENDENT_AMBULATORY_CARE_PROVIDER_SITE_OTHER): Payer: Medicaid Other | Admitting: Obstetrics & Gynecology

## 2021-02-09 ENCOUNTER — Other Ambulatory Visit: Payer: Self-pay

## 2021-02-09 VITALS — BP 107/65 | HR 79 | Wt 218.0 lb

## 2021-02-09 DIAGNOSIS — Z3403 Encounter for supervision of normal first pregnancy, third trimester: Secondary | ICD-10-CM

## 2021-02-09 DIAGNOSIS — O26613 Liver and biliary tract disorders in pregnancy, third trimester: Secondary | ICD-10-CM

## 2021-02-09 DIAGNOSIS — K8020 Calculus of gallbladder without cholecystitis without obstruction: Secondary | ICD-10-CM

## 2021-02-09 MED ORDER — GLYBURIDE 2.5 MG PO TABS: 2.5000 mg | ORAL_TABLET | Freq: Every day | ORAL | 3 refills | Status: DC

## 2021-02-09 NOTE — Progress Notes (Addendum)
° °  LOW-RISK PREGNANCY VISIT Patient name: Wanda Craig MRN [id number]  Date of birth: January 29, 1995 Chief Complaint:   Routine Prenatal Visit  History of Present Illness:   Wanda Craig is a 26 y.o. G1P0 female at [redacted]w[redacted]d with an Estimated Date of Delivery: 04/15/21 being seen today for ongoing management of a low-risk pregnancy.  Depression screen [facility] 2/9 10/04/2020 08/29/2020  Decreased Interest 0 0  Down, Depressed, Hopeless 0 0  PHQ - 2 Score 0 0  Altered sleeping 0 -  Tired, decreased energy 1 -  Change in appetite 0 -  Feeling bad or failure about yourself  0 -  Trouble concentrating 0 -  Moving slowly or fidgety/restless 0 -  Suicidal thoughts 0 -  PHQ-9 Score 1 -    Today she reports no complaints. Contractions: Not present. Vag. Bleeding: None.  Movement: Present. denies leaking of fluid. Review of Systems:   Pertinent items are noted in HPI Denies abnormal vaginal discharge w/ itching/odor/irritation, headaches, visual changes, shortness of breath, chest pain, abdominal pain, severe nausea/vomiting, or problems with urination or bowel movements unless otherwise stated above. Pertinent History Reviewed:  Reviewed past medical,surgical, social, obstetrical and family history.  Reviewed problem list, medications and allergies. Physical Assessment:   Vitals:   02/09/21 0921  BP: 107/65  Pulse: 79  Weight: 218 lb (98.9 kg)  Body mass index is 38.62 kg/m.        Physical Examination:   General appearance: Well appearing, and in no distress  Mental status: Alert, oriented to person, place, and time  Skin: Warm & dry  Cardiovascular: Normal heart rate noted  Respiratory: Normal respiratory effort, no distress  Abdomen: Soft, gravid, nontender  Pelvic: Cervical exam deferred         Extremities: Edema: None  Fetal Status: Fetal Heart Rate (bpm): 150 Fundal Height: 32 cm Movement: Present    Chaperone: n/a    No results found for this or any previous visit (from the past 24  hour(s)).  Assessment & Plan:  1) Low-risk pregnancy G1P0 at [redacted]w[redacted]d with an Estimated Date of Delivery: 04/15/21   2) Gallstones  3) Consistently elevated fasting CBGs all the others are great, begin glyburide 2.5 mg at bedtime   Meds: No orders of the defined types were placed in this encounter.  Labs/procedures today:  Plan:  Continue routine obstetrical care  Next visit: prefers in person    Reviewed: Preterm labor symptoms and general obstetric precautions including but not limited to vaginal bleeding, contractions, leaking of fluid and fetal movement were reviewed in detail with the patient.  All questions were answered. Has home bp cuff. Rx faxed to . Check bp weekly, let us know if >140/90.   Follow-up: Return in about 3 weeks (around 03/02/2021) for LROB.  Orders Placed This Encounter  Procedures   RHO (D) Immune Globulin    Lazaro Arms, MD 02/09/2021 9:51 AM

## 2021-02-09 NOTE — Addendum Note (Signed)
Addended by: Lazaro Arms on: 02/09/2021 11:42 AM   Modules accepted: Orders

## 2021-02-19 ENCOUNTER — Other Ambulatory Visit: Payer: Self-pay | Admitting: *Deleted

## 2021-02-19 ENCOUNTER — Ambulatory Visit (INDEPENDENT_AMBULATORY_CARE_PROVIDER_SITE_OTHER): Payer: Medicaid Other | Admitting: Women's Health

## 2021-02-19 ENCOUNTER — Encounter: Payer: Self-pay | Admitting: Women's Health

## 2021-02-19 ENCOUNTER — Other Ambulatory Visit: Payer: Self-pay

## 2021-02-19 VITALS — BP 132/80 | HR 83 | Wt 219.0 lb

## 2021-02-19 DIAGNOSIS — O0990 Supervision of high risk pregnancy, unspecified, unspecified trimester: Secondary | ICD-10-CM | POA: Diagnosis not present

## 2021-02-19 DIAGNOSIS — Z8632 Personal history of gestational diabetes: Secondary | ICD-10-CM | POA: Insufficient documentation

## 2021-02-19 DIAGNOSIS — O24419 Gestational diabetes mellitus in pregnancy, unspecified control: Secondary | ICD-10-CM

## 2021-02-19 DIAGNOSIS — O0993 Supervision of high risk pregnancy, unspecified, third trimester: Secondary | ICD-10-CM

## 2021-02-19 DIAGNOSIS — Z3A32 32 weeks gestation of pregnancy: Secondary | ICD-10-CM

## 2021-02-19 LAB — POCT URINALYSIS DIPSTICK OB
Blood, UA: NEGATIVE
Glucose, UA: NEGATIVE
Ketones, UA: NEGATIVE
Leukocytes, UA: NEGATIVE
Nitrite, UA: NEGATIVE
POC,PROTEIN,UA: NEGATIVE

## 2021-02-19 NOTE — Patient Instructions (Signed)
Wanda Craig, thank you for choosing our office today! We appreciate the opportunity to meet your healthcare needs. You may receive a short survey by mail, e-mail, or through MyChart. If you are happy with your care we would appreciate if you could take just a few minutes to complete the survey questions. We read all of your comments and take your feedback very seriously. Thank you again for choosing our office.  [facility]  [facility] ([address] [place], [place] [address]) Entrance C, located off of [address] Free 24/7 valet parking   CLASSES: Go to [email or web address] to register for classes (childbirth, breastfeeding, waterbirth, infant CPR, daddy bootcamp, etc.)  Call the office ([phone]) or go to [facility] if: You begin to have strong, frequent contractions Your water breaks.  Sometimes it is a big gush of fluid, sometimes it is just a trickle that keeps getting your panties wet or running down your legs You have vaginal bleeding.  It is normal to have a small amount of spotting if your cervix was checked.  You don't feel your baby moving like normal.  If you don't, get you something to eat and drink and lay down and focus on feeling your baby move.   If your baby is still not moving like normal, you should call the office or go to [facility].  Call the office ([phone]) or go to [facility] for these signs of pre-eclampsia: Severe headache that does not go away with Tylenol Visual changes- seeing spots, double, blurred vision Pain under your right breast or upper abdomen that does not go away with Tums or heartburn medicine Nausea and/or vomiting Severe swelling in your hands, feet, and face   Tdap Vaccine It is recommended that you get the Tdap vaccine during the third trimester of EACH pregnancy to help protect your baby from getting pertussis (whooping cough) 27-36 weeks is the BEST time to do  this so that you can pass the protection on to your baby. During pregnancy is better than after pregnancy, but if you are unable to get it during pregnancy it will be offered at the hospital.  You can get this vaccine with us, at the health department, your family doctor, or some local pharmacies Everyone who will be around your baby should also be up-to-date on their vaccines before the baby comes. Adults (who are not pregnant) only need 1 dose of Tdap during adulthood.   [place] [facility]/Family Doctors [facility] ([facility]): [address], [phone]           [facility]: [address], [phone]                 [facility] ([facility]): [address], [phone] (call to ask if accepting patients) [facility]: [address], [place], [phone]    [place] [facility]/Family Doctors [facility] ([facility]): [address], [phone] [facility]: [address], [phone] [facility]: [address], [phone]  [place] [facility]  [facility] ([facility]): [phone] [facility]: [address], [phone]   [place] [facility]: [address], [phone]  [place] [facility]  [facility]: [address], [phone]  Home Blood Pressure Monitoring for Patients   Your provider has recommended that you check your   blood pressure (BP) at least once a week at home. If you do not have a blood pressure cuff at home, one will be provided for you. Contact your provider if you have not received your monitor within 1 week.  ° °Helpful Tips for Accurate Home Blood Pressure Checks  °Don't smoke, exercise, or drink caffeine 30 minutes before checking your BP °Use the restroom before checking your BP (a full bladder can raise your  pressure) °Relax in a comfortable upright chair °Feet on the ground °Left arm resting comfortably on a flat surface at the level of your heart °Legs uncrossed °Back supported °Sit quietly and don't talk °Place the cuff on your bare arm °Adjust snuggly, so that only two fingertips can fit between your skin and the top of the cuff °Check 2 readings separated by at least one minute °Keep a log of your BP readings °For a visual, please reference this diagram: http://ccnc.care/bpdiagram ° °Provider Name: [facility]     Phone: [phone] ° °Zone 1: ALL CLEAR  °Continue to monitor your symptoms:  °BP reading is less than 140 (top number) or less than 90 (bottom number)  °No right upper stomach pain °No headaches or seeing spots °No feeling nauseated or throwing up °No swelling in face and hands ° °Zone 2: CAUTION °Call your doctor's office for any of the following:  °BP reading is greater than 140 (top number) or greater than 90 (bottom number)  °Stomach pain under your ribs in the middle or right side °Headaches or seeing spots °Feeling nauseated or throwing up °Swelling in face and hands ° °Zone 3: EMERGENCY  °Seek immediate medical care if you have any of the following:  °BP reading is greater than160 (top number) or greater than 110 (bottom number) °Severe headaches not improving with Tylenol °Serious difficulty catching your breath °Any worsening symptoms from Zone 2  °Preterm Labor and Birth Information ° °The normal length of a pregnancy is 39-41 weeks. Preterm labor is when labor starts before 37 completed weeks of pregnancy. °What are the risk factors for preterm labor? °Preterm labor is more likely to occur in women who: °Have certain infections during pregnancy such as a bladder infection, sexually transmitted infection, or infection inside the uterus (chorioamnionitis). °Have a shorter-than-normal cervix. °Have gone into preterm labor before. °Have had surgery on their cervix. °Are younger than age 17  or older than age 35. °Are African American. °Are pregnant with twins or multiple babies (multiple gestation). °Take street drugs or smoke while pregnant. °Do not gain enough weight while pregnant. °Became pregnant shortly after having been pregnant. °What are the symptoms of preterm labor? °Symptoms of preterm labor include: °Cramps similar to those that can happen during a menstrual period. The cramps may happen with diarrhea. °Pain in the abdomen or lower back. °Regular uterine contractions that may feel like tightening of the abdomen. °A feeling of increased pressure in the pelvis. °Increased watery or bloody mucus discharge from the vagina. °Water breaking (ruptured amniotic sac). °Why is it important to recognize signs of preterm labor? °It is important to recognize signs of preterm labor because babies who are born prematurely may not be fully developed. This can put them at an increased risk for: °Long-term (chronic) heart and lung problems. °Difficulty immediately after birth with regulating body systems, including blood sugar, body temperature, heart rate, and breathing rate. °Bleeding in the brain. °Cerebral palsy. °Learning difficulties. °Death. °These risks are highest for babies who are born before 34 weeks   of pregnancy. How is preterm labor treated? Treatment depends on the length of your pregnancy, your condition, and the health of your baby. It may involve: Having a stitch (suture) placed in your cervix to prevent your cervix from opening too early (cerclage). Taking or being given medicines, such as: Hormone medicines. These may be given early in pregnancy to help support the pregnancy. Medicine to stop contractions. Medicines to help mature the babys lungs. These may be prescribed if the risk of delivery is high. Medicines to prevent your baby from developing cerebral palsy. If the labor happens before 34 weeks of pregnancy, you may need to stay in the hospital. What should I do if I  think I am in preterm labor? If you think that you are going into preterm labor, call your health care provider right away. How can I prevent preterm labor in future pregnancies? To increase your chance of having a full-term pregnancy: Do not use any tobacco products, such as cigarettes, chewing tobacco, and e-cigarettes. If you need help quitting, ask your health care provider. Do not use street drugs or medicines that have not been prescribed to you during your pregnancy. Talk with your health care provider before taking any herbal supplements, even if you have been taking them regularly. Make sure you gain a healthy amount of weight during your pregnancy. Watch for infection. If you think that you might have an infection, get it checked right away. Make sure to tell your health care provider if you have gone into preterm labor before. This information is not intended to replace advice given to you by your health care provider. Make sure you discuss any questions you have with your health care provider. Document Revised: 05/08/2018 Document Reviewed: 06/07/2015 Elsevier Patient Education  [place].

## 2021-02-19 NOTE — Progress Notes (Signed)
HIGH-RISK PREGNANCY VISIT Patient name: Wanda Craig MRN [id number]  Date of birth: 1995/02/16 Chief Complaint:   Routine Prenatal Visit  History of Present Illness:   Wanda Craig is a 26 y.o. G1P0 female at [redacted]w[redacted]d with an Estimated Date of Delivery: 04/15/21 being seen today for ongoing management of a high-risk pregnancy complicated by diabetes mellitus A2DM currently on glyburide 2.5mg  PM .    Today she reports  all FBS <95, was told she didn't need to check post prandial since they were all normal last visit . Contractions: Not present. Vag. Bleeding: None.  Movement: Present. denies leaking of fluid.   Depression screen [facility] 2/9 10/04/2020 08/29/2020  Decreased Interest 0 0  Down, Depressed, Hopeless 0 0  PHQ - 2 Score 0 0  Altered sleeping 0 -  Tired, decreased energy 1 -  Change in appetite 0 -  Feeling bad or failure about yourself  0 -  Trouble concentrating 0 -  Moving slowly or fidgety/restless 0 -  Suicidal thoughts 0 -  PHQ-9 Score 1 -     GAD 7 : Generalized Anxiety Score 10/04/2020  Nervous, Anxious, on Edge 0  Control/stop worrying 0  Worry too much - different things 0  Trouble relaxing 0  Restless 0  Easily annoyed or irritable 0  Afraid - awful might happen 0  Total GAD 7 Score 0     Review of Systems:   Pertinent items are noted in HPI Denies abnormal vaginal discharge w/ itching/odor/irritation, headaches, visual changes, shortness of breath, chest pain, abdominal pain, severe nausea/vomiting, or problems with urination or bowel movements unless otherwise stated above. Pertinent History Reviewed:  Reviewed past medical,surgical, social, obstetrical and family history.  Reviewed problem list, medications and allergies. Physical Assessment:   Vitals:   02/19/21 1001  BP: 132/80  Pulse: 83  Weight: 219 lb (99.3 kg)  Body mass index is 38.79 kg/m.           Physical Examination:   General appearance: alert, well appearing, and in no distress  Mental  status: alert, oriented to person, place, and time  Skin: warm & dry   Extremities: Edema: None    Cardiovascular: normal heart rate noted  Respiratory: normal respiratory effort, no distress  Abdomen: gravid, soft, non-tender  Pelvic: Cervical exam deferred         Fetal Status: Fetal Heart Rate (bpm): 130 Fundal Height: 33 cm Movement: Present    Fetal Surveillance Testing today: NST: FHR baseline 130 bpm, Variability: moderate, Accelerations:present, Decelerations:  Absent= Cat 1/reactive Toco: none    Chaperone: N/A    Results for orders placed or performed in visit on 02/19/21 (from the past 24 hour(s))  POC Urinalysis Dipstick OB   Collection Time: 02/19/21 10:31 AM  Result Value Ref Range   Color, UA     Clarity, UA     Glucose, UA Negative Negative   Bilirubin, UA     Ketones, UA neg    Spec Grav, UA     Blood, UA neg    pH, UA     POC,PROTEIN,UA Negative Negative, Trace, Small (1+), Moderate (2+), Large (3+), 4+   Urobilinogen, UA     Nitrite, UA neg    Leukocytes, UA Negative Negative   Appearance     Odor      Assessment & Plan:  High-risk pregnancy: G1P0 at [redacted]w[redacted]d with an Estimated Date of Delivery: 04/15/21   1) A2DM, stable on glyburide 2.5mg  qhs,  ASA, needs EFW u/s, hasn't been to dietician- note routed to [facility] to get this scheduled  2) Cholelithiasis, hasn't heard from [place] [facility], note routed to [facility] to check on this  Meds: No orders of the defined types were placed in this encounter.   Labs/procedures today: NST  Treatment Plan:  Growth u/s q4wks    2x/wk testing @ 32wks or weekly BPP    Deliver @ 39-39.6wks:______   Reviewed: Preterm labor symptoms and general obstetric precautions including but not limited to vaginal bleeding, contractions, leaking of fluid and fetal movement were reviewed in detail with the patient.  All questions were answered. Does have home bp cuff. Office bp cuff given: not applicable. Check bp weekly, let us know if consistently  >140 and/or >90.  Follow-up: Return for efw asap (no visit), thurs nst/nurse, mon hrob md/cnm w/ nst, efw/bpp q 4wks until 39wks.   Future Appointments  Date Time Provider [place]  02/22/2021  9:50 AM [facility] NURSE [facility]  02/26/2021  9:10 AM Roma Schanz, CNM [facility]  03/01/2021  9:30 AM [facility] NURSE [facility]  03/05/2021 11:10 AM [facility]URSE [facility]  03/05/2021 11:50 AM Roma Schanz, CNM [facility]  03/08/2021 10:50 AM [facility] NURSE [facility]  03/12/2021 11:10 AM Florian Buff, MD [facility]  03/13/2021 12:00 PM [place] [facility] [place] [facility] None    Orders Placed This Encounter  Procedures   POC Urinalysis Dipstick OB   Roma Schanz  Attending Physician for the [facility] 02/19/2021 10:52 AM

## 2021-02-22 ENCOUNTER — Other Ambulatory Visit: Payer: Self-pay

## 2021-02-22 ENCOUNTER — Ambulatory Visit (INDEPENDENT_AMBULATORY_CARE_PROVIDER_SITE_OTHER): Payer: Medicaid Other | Admitting: *Deleted

## 2021-02-22 DIAGNOSIS — O24419 Gestational diabetes mellitus in pregnancy, unspecified control: Secondary | ICD-10-CM

## 2021-02-22 NOTE — Progress Notes (Addendum)
° °  NURSE VISIT- NST  SUBJECTIVE:  Wanda Craig is a 26 y.o. G1P0 female at [redacted]w[redacted]d, here for a NST for pregnancy complicated by A2DM currently on Glyburide .  She reports active fetal movement, contractions: none, vaginal bleeding: none, membranes: intact.   OBJECTIVE:  BP 132/77    Pulse 95    LMP  (LMP Unknown)   Appears well, no apparent distress  No results found for this or any previous visit (from the past 24 hour(s)).  NST: FHR baseline 145 bpm, Variability: moderate, Accelerations:present, Decelerations:  Absent= Cat 1/reactive Toco: none   ASSESSMENT: G1P0 at [redacted]w[redacted]d with A2DM currently on glyburide NST reactive  PLAN: EFM strip reviewed by Dr. Charlotta Newton   Recommendations: keep next appointment as scheduled    Annamarie Dawley  02/22/2021 10:28 AM   Chart reviewed for nurse visit. Agree with plan of care.  Myna Hidalgo, DO

## 2021-02-26 ENCOUNTER — Other Ambulatory Visit: Payer: Self-pay

## 2021-02-26 ENCOUNTER — Ambulatory Visit (INDEPENDENT_AMBULATORY_CARE_PROVIDER_SITE_OTHER): Payer: Medicaid Other | Admitting: Obstetrics & Gynecology

## 2021-02-26 ENCOUNTER — Encounter: Payer: Self-pay | Admitting: Obstetrics & Gynecology

## 2021-02-26 VITALS — BP 121/85 | HR 93 | Wt 220.0 lb

## 2021-02-26 DIAGNOSIS — O26613 Liver and biliary tract disorders in pregnancy, third trimester: Secondary | ICD-10-CM | POA: Diagnosis not present

## 2021-02-26 DIAGNOSIS — O0990 Supervision of high risk pregnancy, unspecified, unspecified trimester: Secondary | ICD-10-CM | POA: Diagnosis not present

## 2021-02-26 DIAGNOSIS — O24419 Gestational diabetes mellitus in pregnancy, unspecified control: Secondary | ICD-10-CM

## 2021-02-26 DIAGNOSIS — K8020 Calculus of gallbladder without cholecystitis without obstruction: Secondary | ICD-10-CM

## 2021-02-26 NOTE — Progress Notes (Signed)
HIGH-RISK PREGNANCY VISIT Patient name: Wanda Craig MRN [id number]  Date of birth: [birth year] Chief Complaint:   Routine Prenatal Visit (NST)  History of Present Illness:   Wanda Craig is a 26 y.o. G1P0 female at [redacted]w[redacted]d with an Estimated Date of Delivery: 04/15/21 being seen today for ongoing management of a high-risk pregnancy complicated by Class [id number].    Today she reports no complaints. Contractions: Not present. Vag. Bleeding: None.  Movement: Present. denies leaking of fluid.   Depression screen [facility] 2/9 10/04/2020 08/29/2020  Decreased Interest 0 0  Down, Depressed, Hopeless 0 0  PHQ - 2 Score 0 0  Altered sleeping 0 -  Tired, decreased energy 1 -  Change in appetite 0 -  Feeling bad or failure about yourself  0 -  Trouble concentrating 0 -  Moving slowly or fidgety/restless 0 -  Suicidal thoughts 0 -  PHQ-9 Score 1 -     GAD 7 : Generalized Anxiety Score 10/04/2020  Nervous, Anxious, on Edge 0  Control/stop worrying 0  Worry too much - different things 0  Trouble relaxing 0  Restless 0  Easily annoyed or irritable 0  Afraid - awful might happen 0  Total GAD 7 Score 0     Review of Systems:   Pertinent items are noted in HPI Denies abnormal vaginal discharge w/ itching/odor/irritation, headaches, visual changes, shortness of breath, chest pain, abdominal pain, severe nausea/vomiting, or problems with urination or bowel movements unless otherwise stated above. Pertinent History Reviewed:  Reviewed past medical,surgical, social, obstetrical and family history.  Reviewed problem list, medications and allergies. Physical Assessment:   Vitals:   02/26/21 1140  BP: 121/85  Pulse: 93  Weight: 220 lb (99.8 kg)  Body mass index is 38.97 kg/m.           Physical Examination:   General appearance: alert, well appearing, and in no distress  Mental status: alert, oriented to person, place, and time  Skin: warm & dry   Extremities: Edema: None    Cardiovascular: normal  heart rate noted  Respiratory: normal respiratory effort, no distress  Abdomen: gravid, soft, non-tender  Pelvic: Cervical exam deferred         Fetal Status:     Movement: Present    Fetal Surveillance Testing today: Reactive NST  Wanda Craig is at [redacted]w[redacted]d Estimated Date of Delivery: 04/15/21  NST being performed due to A2DM well controlled on glyburide 2.5 qhs  Today the NST is Reactive  Fetal Monitoring:  Baseline: 140 bpm, Variability: Good {> 6 bpm), Accelerations: Reactive, and Decelerations: Absent   reactive  The accelerations are >15 bpm and more than 2 in 20 minutes  Final diagnosis:  Reactive NST  Florian Buff, MD     Chaperone: N/A    No results found for this or any previous visit (from the past 24 hour(s)).  Assessment & Plan:  High-risk pregnancy: G1P0 at [redacted]w[redacted]d with an Estimated Date of Delivery: 04/15/21   1) A2DM on glyburide 2.5 mg qhs,   2) Cholelithiasis,   Meds: No orders of the defined types were placed in this encounter.   Labs/procedures today: none  Treatment Plan:  twice weekly testing EFW 36 weeks  Reviewed: Preterm labor symptoms and general obstetric precautions including but not limited to vaginal bleeding, contractions, leaking of fluid and fetal movement were reviewed in detail with the patient.  All questions were answered. Does have home bp cuff. Office bp cuff given:  not applicable. Check bp daily, let us know if consistently >140 and/or >90.  Follow-up: Return for keep scheduled.   Future Appointments  Date Time Provider [place]  03/01/2021  9:30 AM [facility] NURSE [facility]  03/01/2021  2:15 PM [facility]  03/05/2021 11:10 AM [facility] NURSE [facility]  03/05/2021 11:50 AM Roma Schanz, CNM [facility]  03/08/2021 10:50 AM [facility]  03/12/2021 11:10 AM Florian Buff, MD [facility]  03/13/2021 12:00 PM [facility] [place] [facility] None    No orders of the defined  types were placed in this encounter.  Florian Buff  Attending Physician for the [facility] [place] [facility] 02/26/2021 12:15 PM

## 2021-03-01 ENCOUNTER — Other Ambulatory Visit: Payer: Self-pay | Admitting: Obstetrics & Gynecology

## 2021-03-01 ENCOUNTER — Other Ambulatory Visit: Payer: Medicaid Other

## 2021-03-01 ENCOUNTER — Ambulatory Visit (INDEPENDENT_AMBULATORY_CARE_PROVIDER_SITE_OTHER): Payer: Medicaid Other | Admitting: *Deleted

## 2021-03-01 ENCOUNTER — Other Ambulatory Visit: Payer: Self-pay

## 2021-03-01 DIAGNOSIS — Z3A33 33 weeks gestation of pregnancy: Secondary | ICD-10-CM

## 2021-03-01 DIAGNOSIS — O24419 Gestational diabetes mellitus in pregnancy, unspecified control: Secondary | ICD-10-CM

## 2021-03-01 DIAGNOSIS — O0990 Supervision of high risk pregnancy, unspecified, unspecified trimester: Secondary | ICD-10-CM | POA: Diagnosis not present

## 2021-03-01 NOTE — Progress Notes (Signed)
° °  NURSE VISIT- NST  SUBJECTIVE:  Wanda Craig is a 26 y.o. G1P0 female at [redacted]w[redacted]d, here for a NST for pregnancy complicated by A2DM currently on Glyburide. .  She reports active fetal movement, contractions: none, vaginal bleeding: none, membranes: intact.   OBJECTIVE:  BP 125/77    Pulse 90    Wt 224 lb (101.6 kg)    LMP  (LMP Unknown)    BMI 39.68 kg/m   Appears well, no apparent distress  No results found for this or any previous visit (from the past 24 hour(s)).  NST: FHR baseline 145 bpm, Variability: moderate, Accelerations:present, Decelerations:  Absent= Cat 1/reactive Toco: none   ASSESSMENT: G1P0 at [redacted]w[redacted]d with A2DM currently on Glyburide NST reactive  PLAN: EFM strip reviewed by Dr. Despina Hidden   Recommendations: keep next appointment as scheduled    Jobe Marker  03/01/2021 11:14 AM

## 2021-03-05 ENCOUNTER — Encounter: Payer: Self-pay | Admitting: Women's Health

## 2021-03-05 ENCOUNTER — Ambulatory Visit (INDEPENDENT_AMBULATORY_CARE_PROVIDER_SITE_OTHER): Payer: Medicaid Other | Admitting: Women's Health

## 2021-03-05 ENCOUNTER — Other Ambulatory Visit: Payer: Self-pay

## 2021-03-05 ENCOUNTER — Telehealth: Payer: Self-pay

## 2021-03-05 VITALS — BP 110/71 | HR 75 | Wt 225.0 lb

## 2021-03-05 DIAGNOSIS — Z6791 Unspecified blood type, Rh negative: Secondary | ICD-10-CM

## 2021-03-05 DIAGNOSIS — O0993 Supervision of high risk pregnancy, unspecified, third trimester: Secondary | ICD-10-CM

## 2021-03-05 DIAGNOSIS — O288 Other abnormal findings on antenatal screening of mother: Secondary | ICD-10-CM

## 2021-03-05 DIAGNOSIS — O26899 Other specified pregnancy related conditions, unspecified trimester: Secondary | ICD-10-CM

## 2021-03-05 DIAGNOSIS — Z1389 Encounter for screening for other disorder: Secondary | ICD-10-CM

## 2021-03-05 DIAGNOSIS — Z331 Pregnant state, incidental: Secondary | ICD-10-CM

## 2021-03-05 DIAGNOSIS — O24419 Gestational diabetes mellitus in pregnancy, unspecified control: Secondary | ICD-10-CM

## 2021-03-05 LAB — POCT URINALYSIS DIPSTICK OB
Blood, UA: NEGATIVE
Glucose, UA: NEGATIVE
Ketones, UA: NEGATIVE
Leukocytes, UA: NEGATIVE
Nitrite, UA: NEGATIVE

## 2021-03-05 NOTE — Telephone Encounter (Signed)
Pt called and stated she wanted to speak to a nurse about her labs.  Her main concern is the protein in her urine.

## 2021-03-05 NOTE — Patient Instructions (Signed)
Wanda Craig, thank you for choosing our office today! We appreciate the opportunity to meet your healthcare needs. You may receive a short survey by mail, e-mail, or through MyChart. If you are happy with your care we would appreciate if you could take just a few minutes to complete the survey questions. We read all of your comments and take your feedback very seriously. Thank you again for choosing our office.  [facility]  [facility] ([address] [place], [place] [address]) Entrance C, located off of [address] Free 24/7 valet parking   CLASSES: Go to [email or web address] to register for classes (childbirth, breastfeeding, waterbirth, infant CPR, daddy bootcamp, etc.)  Call the office ([phone]) or go to [facility] if: You begin to have strong, frequent contractions Your water breaks.  Sometimes it is a big gush of fluid, sometimes it is just a trickle that keeps getting your panties wet or running down your legs You have vaginal bleeding.  It is normal to have a small amount of spotting if your cervix was checked.  You don't feel your baby moving like normal.  If you don't, get you something to eat and drink and lay down and focus on feeling your baby move.   If your baby is still not moving like normal, you should call the office or go to [facility].  Call the office ([phone]) or go to [facility] for these signs of pre-eclampsia: Severe headache that does not go away with Tylenol Visual changes- seeing spots, double, blurred vision Pain under your right breast or upper abdomen that does not go away with Tums or heartburn medicine Nausea and/or vomiting Severe swelling in your hands, feet, and face   Tdap Vaccine It is recommended that you get the Tdap vaccine during the third trimester of EACH pregnancy to help protect your baby from getting pertussis (whooping cough) 27-36 weeks is the BEST time to do  this so that you can pass the protection on to your baby. During pregnancy is better than after pregnancy, but if you are unable to get it during pregnancy it will be offered at the hospital.  You can get this vaccine with us, at the health department, your family doctor, or some local pharmacies Everyone who will be around your baby should also be up-to-date on their vaccines before the baby comes. Adults (who are not pregnant) only need 1 dose of Tdap during adulthood.   [place] Pediatricians/Family Doctors [facility] ([facility]): [address], [phone]           [facility]: [address], [phone]                [facility] ([facility]): [address], [phone] (call to ask if accepting patients) [facility]: [address], [place], [phone]    [place] Pediatricians/Family Doctors [facility] ([facility]): [address], [phone] [facility]: [address], [phone] [facility]: [address], [phone]  [place] [facility]  [facility] ([facility]): [phone] [facility]: [address], [phone]   [place] Family [facility]: [address], [phone]  [place] [facility]  [facility]: [address], [phone]  Home Blood Pressure Monitoring for Patients   Your provider has recommended that you check your   blood pressure (BP) at least once a week at home. If you do not have a blood pressure cuff at home, one will be provided for you. Contact your provider if you have not received your monitor within 1 week.  ° °Helpful Tips for Accurate Home Blood Pressure Checks  °Don't smoke, exercise, or drink caffeine 30 minutes before checking your BP °Use the restroom before checking your BP (a full bladder can raise your  pressure) °Relax in a comfortable upright chair °Feet on the ground °Left arm resting comfortably on a flat surface at the level of your heart °Legs uncrossed °Back supported °Sit quietly and don't talk °Place the cuff on your bare arm °Adjust snuggly, so that only two fingertips can fit between your skin and the top of the cuff °Check 2 readings separated by at least one minute °Keep a log of your BP readings °For a visual, please reference this diagram: http://ccnc.care/bpdiagram ° °Provider Name: [facility]     Phone: [phone] ° °Zone 1: ALL CLEAR  °Continue to monitor your symptoms:  °BP reading is less than 140 (top number) or less than 90 (bottom number)  °No right upper stomach pain °No headaches or seeing spots °No feeling nauseated or throwing up °No swelling in face and hands ° °Zone 2: CAUTION °Call your doctor's office for any of the following:  °BP reading is greater than 140 (top number) or greater than 90 (bottom number)  °Stomach pain under your ribs in the middle or right side °Headaches or seeing spots °Feeling nauseated or throwing up °Swelling in face and hands ° °Zone 3: EMERGENCY  °Seek immediate medical care if you have any of the following:  °BP reading is greater than160 (top number) or greater than 110 (bottom number) °Severe headaches not improving with Tylenol °Serious difficulty catching your breath °Any worsening symptoms from Zone 2  °Preterm Labor and Birth Information ° °The normal length of a pregnancy is 39-41 weeks. Preterm labor is when labor starts before 37 completed weeks of pregnancy. °What are the risk factors for preterm labor? °Preterm labor is more likely to occur in women who: °Have certain infections during pregnancy such as a bladder infection, sexually transmitted infection, or infection inside the uterus (chorioamnionitis). °Have a shorter-than-normal cervix. °Have gone into preterm labor before. °Have had surgery on their cervix. °Are younger than age 17  or older than age 35. °Are African American. °Are pregnant with twins or multiple babies (multiple gestation). °Take street drugs or smoke while pregnant. °Do not gain enough weight while pregnant. °Became pregnant shortly after having been pregnant. °What are the symptoms of preterm labor? °Symptoms of preterm labor include: °Cramps similar to those that can happen during a menstrual period. The cramps may happen with diarrhea. °Pain in the abdomen or lower back. °Regular uterine contractions that may feel like tightening of the abdomen. °A feeling of increased pressure in the pelvis. °Increased watery or bloody mucus discharge from the vagina. °Water breaking (ruptured amniotic sac). °Why is it important to recognize signs of preterm labor? °It is important to recognize signs of preterm labor because babies who are born prematurely may not be fully developed. This can put them at an increased risk for: °Long-term (chronic) heart and lung problems. °Difficulty immediately after birth with regulating body systems, including blood sugar, body temperature, heart rate, and breathing rate. °Bleeding in the brain. °Cerebral palsy. °Learning difficulties. °Death. °These risks are highest for babies who are born before 34 weeks   of pregnancy. How is preterm labor treated? Treatment depends on the length of your pregnancy, your condition, and the health of your baby. It may involve: Having a stitch (suture) placed in your cervix to prevent your cervix from opening too early (cerclage). Taking or being given medicines, such as: Hormone medicines. These may be given early in pregnancy to help support the pregnancy. Medicine to stop contractions. Medicines to help mature the babys lungs. These may be prescribed if the risk of delivery is high. Medicines to prevent your baby from developing cerebral palsy. If the labor happens before 34 weeks of pregnancy, you may need to stay in the hospital. What should I do if I  think I am in preterm labor? If you think that you are going into preterm labor, call your health care provider right away. How can I prevent preterm labor in future pregnancies? To increase your chance of having a full-term pregnancy: Do not use any tobacco products, such as cigarettes, chewing tobacco, and e-cigarettes. If you need help quitting, ask your health care provider. Do not use street drugs or medicines that have not been prescribed to you during your pregnancy. Talk with your health care provider before taking any herbal supplements, even if you have been taking them regularly. Make sure you gain a healthy amount of weight during your pregnancy. Watch for infection. If you think that you might have an infection, get it checked right away. Make sure to tell your health care provider if you have gone into preterm labor before. This information is not intended to replace advice given to you by your health care provider. Make sure you discuss any questions you have with your health care provider. Document Revised: 05/08/2018 Document Reviewed: 06/07/2015 Elsevier Patient Education  [place].

## 2021-03-05 NOTE — Progress Notes (Signed)
HIGH-RISK PREGNANCY VISIT Patient name: Wanda Craig MRN [id number]  Date of birth: [birth year] Chief Complaint:   Routine Prenatal Visit (nst)  History of Present Illness:   Wanda Craig is a 26 y.o. G1P0 female at [redacted]w[redacted]d with an Estimated Date of Delivery: 04/15/21 being seen today for ongoing management of a high-risk pregnancy complicated by diabetes mellitus A2DM currently on glyburide 2.5mg  qhs .    Today she reports  all sugars wnl, hasn't heard from [facility] yet . Contractions: Not present. Vag. Bleeding: None.  Movement: Present. denies leaking of fluid.   Depression screen [facility] 2/9 10/04/2020 08/29/2020  Decreased Interest 0 0  Down, Depressed, Hopeless 0 0  PHQ - 2 Score 0 0  Altered sleeping 0 -  Tired, decreased energy 1 -  Change in appetite 0 -  Feeling bad or failure about yourself  0 -  Trouble concentrating 0 -  Moving slowly or fidgety/restless 0 -  Suicidal thoughts 0 -  PHQ-9 Score 1 -     GAD 7 : Generalized Anxiety Score 10/04/2020  Nervous, Anxious, on Edge 0  Control/stop worrying 0  Worry too much - different things 0  Trouble relaxing 0  Restless 0  Easily annoyed or irritable 0  Afraid - awful might happen 0  Total GAD 7 Score 0   Review of Systems:   Pertinent items are noted in HPI Denies abnormal vaginal discharge w/ itching/odor/irritation, headaches, visual changes, shortness of breath, chest pain, abdominal pain, severe nausea/vomiting, or problems with urination or bowel movements unless otherwise stated above. Pertinent History Reviewed:  Reviewed past medical,surgical, social, obstetrical and family history.  Reviewed problem list, medications and allergies. Physical Assessment:   Vitals:   03/05/21 1215  BP: 110/71  Pulse: 75  Weight: 225 lb (102.1 kg)  Body mass index is 39.86 kg/m.           Physical Examination:   General appearance: alert, well appearing, and in no distress  Mental status: alert, oriented to  person, place, and time  Skin: warm & dry   Extremities: Edema: None    Cardiovascular: normal heart rate noted  Respiratory: normal respiratory effort, no distress  Abdomen: gravid, soft, non-tender  Pelvic: Cervical exam deferred         Fetal Status: Fetal Heart Rate (bpm): 140   Movement: Present    Fetal Surveillance Testing today: NST: FHR baseline 140 bpm, Variability: moderate, Accelerations:present, Decelerations:  Absent= Cat 1/reactive Toco: none    Chaperone: N/A    Results for orders placed or performed in visit on 03/05/21 (from the past 24 hour(s))  POC Urinalysis Dipstick OB   Collection Time: 03/05/21 11:41 AM  Result Value Ref Range   Color, UA     Clarity, UA     Glucose, UA Negative Negative   Bilirubin, UA     Ketones, UA neg    Spec Grav, UA     Blood, UA neg    pH, UA     POC,PROTEIN,UA Trace Negative, Trace, Small (1+), Moderate (2+), Large (3+), 4+   Urobilinogen, UA     Nitrite, UA neg    Leukocytes, UA Negative Negative   Appearance     Odor      Assessment & Plan:  High-risk pregnancy: G1P0 at [redacted]w[redacted]d with an Estimated Date of Delivery: 04/15/21   1) A2DM, stable on glyburide 2.5mg  qhs, has EFW u/s scheduled 2/14  2) Cholelithiasis, stable, still hasn't heard  from [facility] to call them again  Meds: No orders of the defined types were placed in this encounter.   Labs/procedures today: NST  Treatment Plan:   Growth u/s q4wks    2x/wk testing @ 32wks  Deliver @ 39-39.6wks:______   Reviewed: Preterm labor symptoms and general obstetric precautions including but not limited to vaginal bleeding, contractions, leaking of fluid and fetal movement were reviewed in detail with the patient.  All questions were answered. Does have home bp cuff. Office bp cuff given: not applicable. Check bp weekly, let us know if consistently >140 and/or >90.  Follow-up: Return for after 2/14 needs nst/nurse 2/17; then hrob w/ nst md/cnm on  tuesdays and nst/nurse on fridays til 39.   Future Appointments  Date Time Provider [place]  03/08/2021 10:50 AM [facility] NURSE [facility]  03/13/2021  9:00 AM [facility] [place] [facility] None  03/13/2021 10:10 AM Eure, Mertie Clause, MD [facility]    No orders of the defined types were placed in this encounter.  Roma Schanz  Attending Physician for the Center for [facility] [facility] 03/05/2021 12:29 PM

## 2021-03-05 NOTE — Telephone Encounter (Signed)
Pt was concerned about a trace of protein in her urine. Her BP was great today @ 110/71. I advised it's nothing to worry about just yet. When pt comes in for her next visit, have nurse check urine to see if any protein is showing up. Pt voiced understanding. [place]

## 2021-03-08 ENCOUNTER — Ambulatory Visit (INDEPENDENT_AMBULATORY_CARE_PROVIDER_SITE_OTHER): Payer: Medicaid Other | Admitting: *Deleted

## 2021-03-08 ENCOUNTER — Other Ambulatory Visit: Payer: Self-pay

## 2021-03-08 VITALS — BP 115/87 | HR 88 | Wt 223.0 lb

## 2021-03-08 DIAGNOSIS — O24419 Gestational diabetes mellitus in pregnancy, unspecified control: Secondary | ICD-10-CM

## 2021-03-08 DIAGNOSIS — O0990 Supervision of high risk pregnancy, unspecified, unspecified trimester: Secondary | ICD-10-CM | POA: Diagnosis not present

## 2021-03-08 LAB — POCT URINALYSIS DIPSTICK OB
Blood, UA: NEGATIVE
Glucose, UA: NEGATIVE
Ketones, UA: NEGATIVE
Leukocytes, UA: NEGATIVE
Nitrite, UA: NEGATIVE
POC,PROTEIN,UA: NEGATIVE

## 2021-03-08 NOTE — Progress Notes (Addendum)
° °  NURSE VISIT- NST  SUBJECTIVE:  Wanda Craig is a 26 y.o. G1P0 female at [redacted]w[redacted]d, here for a NST for pregnancy complicated by A2DM currently on Glyburide .  She reports active fetal movement, contractions: none, vaginal bleeding: none, membranes: intact.   OBJECTIVE:  BP 115/87    Pulse 88    Wt 223 lb (101.2 kg)    LMP  (LMP Unknown)    BMI 39.50 kg/m   Appears well, no apparent distress  Results for orders placed or performed in visit on 03/08/21 (from the past 24 hour(s))  POC Urinalysis Dipstick OB   Collection Time: 03/08/21 12:48 PM  Result Value Ref Range   Color, UA     Clarity, UA     Glucose, UA Negative Negative   Bilirubin, UA     Ketones, UA neg    Spec Grav, UA     Blood, UA neg    pH, UA     POC,PROTEIN,UA Negative Negative, Trace, Small (1+), Moderate (2+), Large (3+), 4+   Urobilinogen, UA     Nitrite, UA neg    Leukocytes, UA Negative Negative   Appearance     Odor      NST: FHR baseline 140 bpm, Variability: moderate, Accelerations:present, Decelerations:  Absent= Cat 1/reactive Toco: none   ASSESSMENT: G1P0 at [redacted]w[redacted]d with A2DM currently on Glyburide NST reactive  PLAN: EFM strip reviewed by Dr. Charlotta Newton   Recommendations: keep next appointment as scheduled    Jobe Marker  03/08/2021 12:48 PM

## 2021-03-12 ENCOUNTER — Encounter: Payer: Medicaid Other | Admitting: Obstetrics & Gynecology

## 2021-03-13 ENCOUNTER — Ambulatory Visit (INDEPENDENT_AMBULATORY_CARE_PROVIDER_SITE_OTHER): Payer: Medicaid Other | Admitting: Obstetrics & Gynecology

## 2021-03-13 ENCOUNTER — Encounter: Payer: Self-pay | Admitting: Obstetrics & Gynecology

## 2021-03-13 ENCOUNTER — Other Ambulatory Visit: Payer: Self-pay

## 2021-03-13 ENCOUNTER — Ambulatory Visit (INDEPENDENT_AMBULATORY_CARE_PROVIDER_SITE_OTHER): Payer: Medicaid Other

## 2021-03-13 VITALS — BP 136/81 | HR 93 | Wt 228.0 lb

## 2021-03-13 DIAGNOSIS — Z6791 Unspecified blood type, Rh negative: Secondary | ICD-10-CM

## 2021-03-13 DIAGNOSIS — O0990 Supervision of high risk pregnancy, unspecified, unspecified trimester: Secondary | ICD-10-CM

## 2021-03-13 DIAGNOSIS — O24419 Gestational diabetes mellitus in pregnancy, unspecified control: Secondary | ICD-10-CM

## 2021-03-13 DIAGNOSIS — Z3A35 35 weeks gestation of pregnancy: Secondary | ICD-10-CM | POA: Diagnosis not present

## 2021-03-13 DIAGNOSIS — O26899 Other specified pregnancy related conditions, unspecified trimester: Secondary | ICD-10-CM

## 2021-03-13 NOTE — Progress Notes (Signed)
[place] 35+2 wks,cephalic,BPP 8/8,anterior placenta gr 3,AFI 14.6 cm,FHR 137 bpm,EFW 2953 g 80%

## 2021-03-13 NOTE — Progress Notes (Signed)
HIGH-RISK PREGNANCY VISIT Patient name: Wanda Craig MRN [id number]  Date of birth: 01-Oct-1995 Chief Complaint:   Routine Prenatal Visit  History of Present Illness:   Wanda Craig is a 26 y.o. G1P0 female at [redacted]w[redacted]d with an Estimated Date of Delivery: 04/15/21 being seen today for ongoing management of a high-risk pregnancy complicated by diabetes mellitus A2DM currently on glyburide 2.5 mg qhs .    Today she reports no complaints. Contractions: Not present. Vag. Bleeding: None.  Movement: Present. denies leaking of fluid.   Depression screen [facility] 2/9 10/04/2020 08/29/2020  Decreased Interest 0 0  Down, Depressed, Hopeless 0 0  PHQ - 2 Score 0 0  Altered sleeping 0 -  Tired, decreased energy 1 -  Change in appetite 0 -  Feeling bad or failure about yourself  0 -  Trouble concentrating 0 -  Moving slowly or fidgety/restless 0 -  Suicidal thoughts 0 -  PHQ-9 Score 1 -     GAD 7 : Generalized Anxiety Score 10/04/2020  Nervous, Anxious, on Edge 0  Control/stop worrying 0  Worry too much - different things 0  Trouble relaxing 0  Restless 0  Easily annoyed or irritable 0  Afraid - awful might happen 0  Total GAD 7 Score 0     Review of Systems:   Pertinent items are noted in HPI Denies abnormal vaginal discharge w/ itching/odor/irritation, headaches, visual changes, shortness of breath, chest pain, abdominal pain, severe nausea/vomiting, or problems with urination or bowel movements unless otherwise stated above. Pertinent History Reviewed:  Reviewed past medical,surgical, social, obstetrical and family history.  Reviewed problem list, medications and allergies. Physical Assessment:   Vitals:   03/13/21 0940  BP: 136/81  Pulse: 93  Weight: 228 lb (103.4 kg)  Body mass index is 40.39 kg/m.           Physical Examination:   General appearance: alert, well appearing, and in no distress  Mental status: alert, oriented to person, place, and time  Skin: warm & dry    Extremities: Edema: Trace    Cardiovascular: normal heart rate noted  Respiratory: normal respiratory effort, no distress  Abdomen: gravid, soft, non-tender  Pelvic: Cervical exam deferred         Fetal Status:     Movement: Present    Fetal Surveillance Testing today: BPP 8/8 EFW 80%   Chaperone: N/A    No results found for this or any previous visit (from the past 24 hour(s)).  Assessment & Plan:  High-risk pregnancy: G1P0 at [redacted]w[redacted]d with an Estimated Date of Delivery: 04/15/21   1) A2DM perfect control on glyburide 2.5 mg,   2) Cholelithiasis, stable  Meds: No orders of the defined types were placed in this encounter.   Labs/procedures today: U/S  Treatment Plan:  IOL 39 weeks, continue protocol surveillance  Reviewed: Preterm labor symptoms and general obstetric precautions including but not limited to vaginal bleeding, contractions, leaking of fluid and fetal movement were reviewed in detail with the patient.  All questions were answered. Does have home bp cuff. Office bp cuff given: not applicable. Check bp weekly, let us know if consistently >140 and/or >90.  Follow-up: No follow-ups on file.   Future Appointments  Date Time Provider Department Center  03/13/2021 10:10 AM Lazaro Arms, MD [facility]  03/19/2021 10:50 AM [facility] NURSE [facility]  03/22/2021 10:50 AM Jacklyn Shell, CNM [facility]  03/26/2021 11:10 AM [facility] NURSE [facility]  03/29/2021 11:10  AM Lazaro Arms, MD [facility]  04/02/2021 11:10 AM [facility] NURSE [facility]  04/05/2021 10:30 AM Lazaro Arms, MD [facility]  04/09/2021 10:50 AM [facility] NURSE [facility]  04/12/2021 10:50 AM Myna Hidalgo, DO [facility]    No orders of the defined types were placed in this encounter.  Lazaro Arms  Attending Physician for the Center for [facility] 03/13/2021 10:08 AM

## 2021-03-19 ENCOUNTER — Ambulatory Visit (INDEPENDENT_AMBULATORY_CARE_PROVIDER_SITE_OTHER): Payer: Medicaid Other | Admitting: *Deleted

## 2021-03-19 ENCOUNTER — Other Ambulatory Visit: Payer: Self-pay

## 2021-03-19 VITALS — BP 122/72 | HR 84 | Wt 230.0 lb

## 2021-03-19 DIAGNOSIS — K8020 Calculus of gallbladder without cholecystitis without obstruction: Secondary | ICD-10-CM

## 2021-03-19 DIAGNOSIS — O0990 Supervision of high risk pregnancy, unspecified, unspecified trimester: Secondary | ICD-10-CM | POA: Diagnosis not present

## 2021-03-19 DIAGNOSIS — O24419 Gestational diabetes mellitus in pregnancy, unspecified control: Secondary | ICD-10-CM

## 2021-03-19 DIAGNOSIS — Z6791 Unspecified blood type, Rh negative: Secondary | ICD-10-CM

## 2021-03-19 LAB — POCT URINALYSIS DIPSTICK OB
Blood, UA: NEGATIVE
Glucose, UA: NEGATIVE
Ketones, UA: NEGATIVE
Leukocytes, UA: NEGATIVE
Nitrite, UA: NEGATIVE
POC,PROTEIN,UA: NEGATIVE

## 2021-03-19 NOTE — Progress Notes (Signed)
° °  NURSE VISIT- NST  SUBJECTIVE:  Wanda Craig is a 26 y.o. G1P0 female at [redacted]w[redacted]d, here for a NST for pregnancy complicated by A2DM currently on Glyburide .  She reports active fetal movement, contractions: none, vaginal bleeding: none, membranes: intact.   OBJECTIVE:  BP 122/72    Pulse 84    Wt 230 lb (104.3 kg)    LMP  (LMP Unknown)    BMI 40.74 kg/m   Appears well, no apparent distress  Results for orders placed or performed in visit on 03/19/21 (from the past 24 hour(s))  POC Urinalysis Dipstick OB   Collection Time: 03/19/21 12:00 PM  Result Value Ref Range   Color, UA     Clarity, UA     Glucose, UA Negative Negative   Bilirubin, UA     Ketones, UA neg    Spec Grav, UA     Blood, UA neg    pH, UA     POC,PROTEIN,UA Negative Negative, Trace, Small (1+), Moderate (2+), Large (3+), 4+   Urobilinogen, UA     Nitrite, UA neg    Leukocytes, UA Negative Negative   Appearance     Odor      NST: FHR baseline 145 bpm, Variability: moderate, Accelerations:present, Decelerations:  Absent= Cat 1/reactive Toco: none   ASSESSMENT: G1P0 at [redacted]w[redacted]d with A2DM currently on Glyburide NST reactive  PLAN: EFM strip reviewed by Dr. Charlotta Newton   Recommendations: keep next appointment as scheduled    Jobe Marker  03/19/2021 12:01 PM

## 2021-03-22 ENCOUNTER — Other Ambulatory Visit: Payer: Self-pay

## 2021-03-22 ENCOUNTER — Ambulatory Visit (INDEPENDENT_AMBULATORY_CARE_PROVIDER_SITE_OTHER): Payer: Medicaid Other | Admitting: Advanced Practice Midwife

## 2021-03-22 ENCOUNTER — Other Ambulatory Visit (HOSPITAL_COMMUNITY)
Admission: RE | Admit: 2021-03-22 | Discharge: 2021-03-22 | Disposition: A | Discharge status: Home / Self Care | Payer: Medicaid Other | Source: Ambulatory Visit | Attending: Advanced Practice Midwife | Admitting: Advanced Practice Midwife

## 2021-03-22 ENCOUNTER — Encounter: Payer: Self-pay | Admitting: Advanced Practice Midwife

## 2021-03-22 VITALS — BP 125/75 | HR 88 | Wt 231.0 lb

## 2021-03-22 DIAGNOSIS — O24419 Gestational diabetes mellitus in pregnancy, unspecified control: Secondary | ICD-10-CM

## 2021-03-22 DIAGNOSIS — O0990 Supervision of high risk pregnancy, unspecified, unspecified trimester: Secondary | ICD-10-CM

## 2021-03-22 DIAGNOSIS — Z1389 Encounter for screening for other disorder: Secondary | ICD-10-CM

## 2021-03-22 DIAGNOSIS — Z3A36 36 weeks gestation of pregnancy: Secondary | ICD-10-CM | POA: Diagnosis not present

## 2021-03-22 DIAGNOSIS — Z331 Pregnant state, incidental: Secondary | ICD-10-CM

## 2021-03-22 LAB — POCT URINALYSIS DIPSTICK OB
Blood, UA: NEGATIVE
Glucose, UA: NEGATIVE
Ketones, UA: NEGATIVE
Leukocytes, UA: NEGATIVE
Nitrite, UA: NEGATIVE
POC,PROTEIN,UA: NEGATIVE

## 2021-03-22 LAB — OB RESULTS CONSOLE GC/CHLAMYDIA: Gonorrhea: NEGATIVE

## 2021-03-22 NOTE — Progress Notes (Signed)
HIGH-RISK PREGNANCY VISIT Patient name: Wanda Craig MRN [id number]  Date of birth: [birth year] Chief Complaint:   Routine Prenatal Visit (culture) and Non-stress Test  History of Present Illness:   Wanda Craig is a 26 y.o. G1P0 female at [redacted]w[redacted]d with an Estimated Date of Delivery: 04/15/21 being seen today for ongoing management of a high-risk pregnancy complicated by [id number] currently on glyburide 2.5mg  HS Today she reports FBS: <90 2hr pp <120. Contractions: Irritability.  .  Movement: Present. denies leaking of fluid.  Review of Systems:   Pertinent items are noted in HPI Denies abnormal vaginal discharge w/ itching/odor/irritation, headaches, visual changes, shortness of breath, chest pain, abdominal pain, severe nausea/vomiting, or problems with urination or bowel movements unless otherwise stated above. Pertinent History Reviewed:  Reviewed past medical,surgical, social, obstetrical and family history.  Reviewed problem list, medications and allergies. Physical Assessment:   Vitals:   03/22/21 1114  BP: 125/75  Pulse: 88  Weight: 231 lb (104.8 kg)  Body mass index is 40.92 kg/m.           Physical Examination:   General appearance: alert, well appearing, and in no distress  Mental status: alert, oriented to person, place, and time  Skin: warm & dry   Extremities: Edema: Trace    Cardiovascular: normal heart rate noted  Respiratory: normal respiratory effort, no distress  Abdomen: gravid, soft, non-tender  Pelvic: Cervical exam performed  Dilation: Closed Effacement (%): Thick    Fetal Status: Fetal Heart Rate (bpm): 145   Movement: Present Presentation: Vertex  Fetal Surveillance Testing today: NST   Results for orders placed or performed in visit on 03/22/21 (from the past 24 hour(s))  POC Urinalysis Dipstick OB   Collection Time: 03/22/21 11:38 AM  Result Value Ref Range   Color, UA     Clarity, UA     Glucose, UA Negative Negative   Bilirubin, UA     Ketones, UA  neg    Spec Grav, UA     Blood, UA neg    pH, UA     POC,PROTEIN,UA Negative Negative, Trace, Small (1+), Moderate (2+), Large (3+), 4+   Urobilinogen, UA     Nitrite, UA neg    Leukocytes, UA Negative Negative   Appearance     Odor      Assessment & Plan:  1) High-risk pregnancy G1P0 at [redacted]w[redacted]d with an Estimated Date of Delivery: 04/15/21   2) A2DM perfect control on glyburide 2.5 mg,    3) Cholelithiasis, stable   Meds: No orders of the defined types were placed in this encounter.   Labs/procedures today: NST: FHR baseline 140 bpm, Variability: moderate, Accelerations:present, Decelerations:  Absent= Cat 1/Reactive    Reviewed: Term labor symptoms and general obstetric precautions including but not limited to vaginal bleeding, contractions, leaking of fluid and fetal movement were reviewed in detail with the patient.  All questions were answered. Has home bp cuff.. Check bp weekly, let us know if >140/90.   Follow-up: Return for add EFW to one of her upcoming appts, or change a nurse NST to EFW [place].  Future Appointments  Date Time Provider [place]  03/26/2021 11:10 AM [facility] NURSE [facility]  03/29/2021 11:10 AM Florian Buff, MD [facility]  04/02/2021 11:10 AM [facility] NURSE [facility]  04/05/2021 10:30 AM Florian Buff, MD [facility]  04/09/2021 10:50 AM [facility] NURSE [facility]  04/12/2021 10:50 AM Janyth Pupa, DO [facility]  Orders Placed This Encounter  Procedures   Culture, beta strep (group b only)   US OB Follow Up   POC Urinalysis Dipstick OB   Christin Fudge DNP, CNM 03/22/2021 12:27 PM

## 2021-03-23 LAB — CERVICOVAGINAL ANCILLARY ONLY
Chlamydia: NEGATIVE
Comment: NEGATIVE
Comment: NORMAL
Neisseria Gonorrhea: NEGATIVE

## 2021-03-24 ENCOUNTER — Encounter: Payer: Self-pay | Admitting: Advanced Practice Midwife

## 2021-03-26 ENCOUNTER — Inpatient Hospital Stay (HOSPITAL_COMMUNITY)
Admission: AD | Admit: 2021-03-26 | Discharge: 2021-03-26 | Disposition: A | Discharge status: Home / Self Care | Payer: Medicaid Other | Attending: Family Medicine | Admitting: Family Medicine

## 2021-03-26 ENCOUNTER — Other Ambulatory Visit: Payer: Self-pay

## 2021-03-26 ENCOUNTER — Ambulatory Visit (INDEPENDENT_AMBULATORY_CARE_PROVIDER_SITE_OTHER): Payer: Medicaid Other

## 2021-03-26 ENCOUNTER — Encounter (HOSPITAL_COMMUNITY): Payer: Self-pay | Admitting: Family Medicine

## 2021-03-26 VITALS — BP 142/87 | HR 99 | Wt 231.4 lb

## 2021-03-26 DIAGNOSIS — Z3A37 37 weeks gestation of pregnancy: Secondary | ICD-10-CM | POA: Insufficient documentation

## 2021-03-26 DIAGNOSIS — O26893 Other specified pregnancy related conditions, third trimester: Secondary | ICD-10-CM | POA: Diagnosis not present

## 2021-03-26 DIAGNOSIS — R519 Headache, unspecified: Secondary | ICD-10-CM | POA: Diagnosis not present

## 2021-03-26 DIAGNOSIS — O163 Unspecified maternal hypertension, third trimester: Secondary | ICD-10-CM

## 2021-03-26 DIAGNOSIS — O0990 Supervision of high risk pregnancy, unspecified, unspecified trimester: Secondary | ICD-10-CM

## 2021-03-26 DIAGNOSIS — O0993 Supervision of high risk pregnancy, unspecified, third trimester: Secondary | ICD-10-CM

## 2021-03-26 DIAGNOSIS — O133 Gestational [pregnancy-induced] hypertension without significant proteinuria, third trimester: Secondary | ICD-10-CM | POA: Diagnosis not present

## 2021-03-26 HISTORY — DX: Gestational diabetes mellitus in pregnancy, unspecified control: O24.419

## 2021-03-26 HISTORY — DX: Calculus of gallbladder without cholecystitis without obstruction: K80.20

## 2021-03-26 LAB — CBC
HCT: 35.3 % — ABNORMAL LOW (ref 36.0–46.0)
Hemoglobin: 11.6 g/dL — ABNORMAL LOW (ref 12.0–15.0)
MCH: 29.6 pg (ref 26.0–34.0)
MCHC: 32.9 g/dL (ref 30.0–36.0)
MCV: 90.1 fL (ref 80.0–100.0)
Platelets: 229 10*3/uL (ref 150–400)
RBC: 3.92 MIL/uL (ref 3.87–5.11)
RDW: 13.2 % (ref 11.5–15.5)
WBC: 8.2 10*3/uL (ref 4.0–10.5)
nRBC: 0 % (ref 0.0–0.2)

## 2021-03-26 LAB — POCT URINALYSIS DIPSTICK OB
Blood, UA: NEGATIVE
Ketones, UA: NEGATIVE
Leukocytes, UA: NEGATIVE
Nitrite, UA: NEGATIVE

## 2021-03-26 LAB — COMPREHENSIVE METABOLIC PANEL
ALT: 16 U/L (ref 0–44)
AST: 19 U/L (ref 15–41)
Albumin: 2.7 g/dL — ABNORMAL LOW (ref 3.5–5.0)
Alkaline Phosphatase: 168 U/L — ABNORMAL HIGH (ref 38–126)
Anion gap: 12 (ref 5–15)
BUN: 7 mg/dL (ref 6–20)
CO2: 19 mmol/L — ABNORMAL LOW (ref 22–32)
Calcium: 9.1 mg/dL (ref 8.9–10.3)
Chloride: 105 mmol/L (ref 98–111)
Creatinine, Ser: 0.72 mg/dL (ref 0.44–1.00)
GFR, Estimated: >60 mL/min (ref >60)
Glucose, Bld: 97 mg/dL (ref 70–99)
Potassium: 4.6 mmol/L (ref 3.5–5.1)
Sodium: 136 mmol/L (ref 135–145)
Total Bilirubin: 0.1 mg/dL — ABNORMAL LOW (ref 0.3–1.2)
Total Protein: 6.5 g/dL (ref 6.5–8.1)

## 2021-03-26 LAB — URINALYSIS, ROUTINE W REFLEX MICROSCOPIC
Bilirubin Urine: NEGATIVE
Glucose, UA: NEGATIVE mg/dL
Hgb urine dipstick: NEGATIVE
Ketones, ur: NEGATIVE mg/dL
Leukocytes,Ua: NEGATIVE
Nitrite: NEGATIVE
Protein, ur: NEGATIVE mg/dL
Specific Gravity, Urine: 1.017 (ref 1.005–1.030)
pH: 6 (ref 5.0–8.0)

## 2021-03-26 LAB — PROTEIN / CREATININE RATIO, URINE
Creatinine, Urine: 79.06 mg/dL
Protein Creatinine Ratio: 0.15 mg/mg{Cre} (ref 0.00–0.15)
Total Protein, Urine: 12 mg/dL

## 2021-03-26 MED ORDER — ACETAMINOPHEN 325 MG PO TABS: 650.0000 mg | ORAL_TABLET | Freq: Four times a day (QID) | ORAL | Status: DC | PRN

## 2021-03-26 MED ORDER — CYCLOBENZAPRINE HCL 5 MG PO TABS
10.0000 mg | ORAL_TABLET | Freq: Once | ORAL | Status: DC
  Filled 2021-03-26: qty 2

## 2021-03-26 MED ORDER — ACETAMINOPHEN 500 MG PO TABS
1000.0000 mg | ORAL_TABLET | Freq: Once | ORAL | Status: AC
  Administered 2021-03-26: 1000 mg via ORAL
  Filled 2021-03-26 (×2): qty 2

## 2021-03-26 NOTE — MAU Provider Note (Signed)
History     CSN: [id number]  Arrival date and time: 03/26/21 1409   Event Date/Time   First Provider Initiated Contact with Patient 03/26/21 1450      Chief Complaint  Patient presents with   Hypertension   HPI Wanda Craig is a 26 y.o. G1P0 at 46w1dwho presents with elevated blood pressures in the office today. She states this is the first time she has had any issues with her blood pressures. She denies any visual changes or epigastric pain. She reports a "sinus headache" around her eyes and nose that she rates a 4/10. This is not new for her and has been ongoing for about a week. She denies any abdominal pain, vaginal bleeding or discharge. Reports normal fetal movement.   OB History     Gravida  1   Para      Term      Preterm      AB      Living         SAB      IAB      Ectopic      Multiple      Live Births              Past Medical History:  Diagnosis Date   Gall stones    Gestational diabetes    Medical history non-contributory     Past Surgical History:  Procedure Laterality Date   NO PAST SURGERIES      Family History  Problem Relation Age of Onset   Arthritis Mother    Breast cancer Maternal Aunt    Diabetes Paternal Uncle    Dementia Maternal Grandmother    Mental illness Paternal Grandmother    Schizophrenia Paternal Grandmother     Social History   Tobacco Use   Smoking status: Never   Smokeless tobacco: Never  Vaping Use   Vaping Use: Never used  Substance Use Topics   Alcohol use: Not Currently   Drug use: No    Allergies:  Allergies  Allergen Reactions   Other Other (See Comments)    Pt requests no narcotic orders.  States she has no substance issues; she states she prefers to be cognizant of whether or not she is getting better and does not want her pain to be "muted."     Medications Prior to Admission  Medication Sig Dispense Refill Last Dose   cetirizine (ZYRTEC) 10 MG tablet Take 10 mg by mouth daily.    03/26/2021   fluticasone (FLONASE) 50 MCG/ACT nasal spray Place 2 sprays into both nostrils daily. 16 g 12 03/26/2021   glyBURIDE (DIABETA) 2.5 MG tablet Take 1 tablet (2.5 mg total) by mouth at bedtime. 30 tablet 3 03/25/2021   pantoprazole (PROTONIX) 20 MG tablet Take 1 tablet (20 mg total) by mouth daily. 30 tablet 1 03/25/2021   Prenatal Vit-Fe Fumarate-FA (MULTIVITAMIN-PRENATAL) 27-0.8 MG TABS tablet Take 1 tablet by mouth daily at 12 noon.   03/26/2021   Accu-Chek Softclix Lancets lancets Use as instructed to check blood sugar 4 times daily 100 each 12    aspirin 81 MG chewable tablet Chew 2 tablets (162 mg total) by mouth daily. 60 tablet 7    Blood Glucose Monitoring Suppl (ACCU-CHEK GUIDE ME) w/Device KIT 1 each by Does not apply route 4 (four) times daily. 1 kit 0    Blood Pressure Monitor MISC For regular home bp monitoring during pregnancy 1 each 0  glucose blood (ACCU-CHEK GUIDE) test strip Use as instructed to check blood sugar 4 times daily 50 each 12    ondansetron (ZOFRAN-ODT) 4 MG disintegrating tablet Take 1 tablet (4 mg total) by mouth every 6 (six) hours as needed for nausea. (Patient not taking: Reported on 03/19/2021) 20 tablet 0    promethazine (PHENERGAN) 25 MG tablet Take 0.5-1 tablets (12.5-25 mg total) by mouth every 6 (six) hours as needed for nausea or vomiting. (Patient not taking: Reported on 02/09/2021) 30 tablet 0     Review of Systems  Constitutional: Negative.  Negative for fatigue and fever.  HENT: Negative.    Respiratory: Negative.  Negative for shortness of breath.   Cardiovascular: Negative.  Negative for chest pain.  Gastrointestinal: Negative.  Negative for abdominal pain, constipation, diarrhea, nausea and vomiting.  Genitourinary: Negative.  Negative for dysuria, vaginal bleeding and vaginal discharge.  Neurological:  Positive for headaches. Negative for dizziness.  Physical Exam   Blood pressure 122/72, pulse 90, temperature 98 F (36.7 C), resp.  rate 18, height 5' 3" (1.6 m), weight 105.2 kg, SpO2 99 %.  Patient Vitals for the past 24 hrs:  BP Temp Pulse Resp SpO2 Height Weight  03/26/21 1645 (!) 122/59 -- 76 -- 98 % -- --  03/26/21 1640 -- -- -- -- 99 % -- --  03/26/21 1635 -- -- -- -- 99 % -- --  03/26/21 1631 131/80 -- 84 -- -- -- --  03/26/21 1630 -- -- -- -- 99 % -- --  03/26/21 1625 -- -- -- -- 99 % -- --  03/26/21 1620 -- -- -- -- 98 % -- --  03/26/21 1616 130/76 -- 90 -- -- -- --  03/26/21 1615 -- -- -- -- 99 % -- --  03/26/21 1610 -- -- -- -- 99 % -- --  03/26/21 1605 -- -- -- -- 99 % -- --  03/26/21 1601 136/67 -- 82 -- -- -- --  03/26/21 1600 -- -- -- -- 98 % -- --  03/26/21 1555 -- -- -- -- 99 % -- --  03/26/21 1550 -- -- -- -- 99 % -- --  03/26/21 1545 129/78 -- 92 -- 100 % -- --  03/26/21 1541 126/75 -- 81 -- -- -- --  03/26/21 1535 -- -- -- -- 99 % -- --  03/26/21 1530 125/88 -- 89 -- 99 % -- --  03/26/21 1525 -- -- -- -- 99 % -- --  03/26/21 1520 -- -- -- -- 99 % -- --  03/26/21 1516 137/70 -- 89 -- -- -- --  03/26/21 1515 -- -- -- -- 99 % -- --  03/26/21 1510 -- -- -- -- 99 % -- --  03/26/21 1505 -- -- -- -- 99 % -- --  03/26/21 1500 124/76 -- 83 -- 99 % -- --  03/26/21 1455 -- -- -- -- 98 % -- --  03/26/21 1450 -- -- -- -- 99 % -- --  03/26/21 1446 122/72 -- 90 -- -- -- --  03/26/21 1445 -- -- -- -- 99 % -- --  03/26/21 1443 133/70 -- 87 -- -- -- --  03/26/21 1422 137/77 98 F (36.7 C) 92 18 -- 5' 3" (1.6 m) 105.2 kg     Physical Exam Vitals and nursing note reviewed.  Constitutional:      General: She is not in acute distress.    Appearance: She is well-developed.  HENT:     Head: Normocephalic.  Eyes:     Pupils: Pupils are equal, round, and reactive to light.  Cardiovascular:     Rate and Rhythm: Normal rate and regular rhythm.     Heart sounds: Normal heart sounds.  Pulmonary:     Effort: Pulmonary effort is normal. No respiratory distress.     Breath sounds: Normal breath sounds.   Abdominal:     General: Bowel sounds are normal. There is no distension.     Palpations: Abdomen is soft.     Tenderness: There is no abdominal tenderness.  Skin:    General: Skin is warm and dry.  Neurological:     Mental Status: She is alert and oriented to person, place, and time.  Psychiatric:        Mood and Affect: Mood normal.        Behavior: Behavior normal.        Thought Content: Thought content normal.        Judgment: Judgment normal.   Fetal Tracing:  Baseline: 125 Variability: moderate Accels: 15x15 Decels: none  Toco: UI  MAU Course  Procedures Results for orders placed or performed during the hospital encounter of 03/26/21 (from the past 24 hour(s))  CBC     Status: Abnormal   Collection Time: 03/26/21  2:36 PM  Result Value Ref Range   WBC 8.2 4.0 - 10.5 K/uL   RBC 3.92 3.87 - 5.11 MIL/uL   Hemoglobin 11.6 (L) 12.0 - 15.0 g/dL   HCT 35.3 (L) 36.0 - 46.0 %   MCV 90.1 80.0 - 100.0 fL   MCH 29.6 26.0 - 34.0 pg   MCHC 32.9 30.0 - 36.0 g/dL   RDW 13.2 11.5 - 15.5 %   Platelets 229 150 - 400 K/uL   nRBC 0.0 0.0 - 0.2 %  Comprehensive metabolic panel     Status: Abnormal   Collection Time: 03/26/21  2:36 PM  Result Value Ref Range   Sodium 136 135 - 145 mmol/L   Potassium 4.6 3.5 - 5.1 mmol/L   Chloride 105 98 - 111 mmol/L   CO2 19 (L) 22 - 32 mmol/L   Glucose, Bld 97 70 - 99 mg/dL   BUN 7 6 - 20 mg/dL   Creatinine, Ser 0.72 0.44 - 1.00 mg/dL   Calcium 9.1 8.9 - 10.3 mg/dL   Total Protein 6.5 6.5 - 8.1 g/dL   Albumin 2.7 (L) 3.5 - 5.0 g/dL   AST 19 15 - 41 U/L   ALT 16 0 - 44 U/L   Alkaline Phosphatase 168 (H) 38 - 126 U/L   Total Bilirubin 0.1 (L) 0.3 - 1.2 mg/dL   GFR, Estimated >60 >60 mL/min   Anion gap 12 5 - 15  Protein / creatinine ratio, urine     Status: None   Collection Time: 03/26/21  2:38 PM  Result Value Ref Range   Creatinine, Urine 79.06 mg/dL   Total Protein, Urine 12 mg/dL   Protein Creatinine Ratio 0.15 0.00 - 0.15  mg/mg[Cre]  Urinalysis, Routine w reflex microscopic Urine, Clean Catch     Status: Abnormal   Collection Time: 03/26/21  2:38 PM  Result Value Ref Range   Color, Urine YELLOW YELLOW   APPearance HAZY (A) CLEAR   Specific Gravity, Urine 1.017 1.005 - 1.030   pH 6.0 5.0 - 8.0   Glucose, UA NEGATIVE NEGATIVE mg/dL   Hgb urine dipstick NEGATIVE NEGATIVE   Bilirubin Urine NEGATIVE NEGATIVE   Ketones, ur NEGATIVE NEGATIVE  mg/dL   Protein, ur NEGATIVE NEGATIVE mg/dL   Nitrite NEGATIVE NEGATIVE   Leukocytes,Ua NEGATIVE NEGATIVE    MDM UA CBC, CMP, Protein/creat ratio Tylenol PO- HA now a 2/10  Consulted with Dr. Nehemiah Settle regarding follow up plan- ok to have patient keep scheduled appointment on Thursday for a BP check with gHTN precautions.  Assessment and Plan   1. Elevated blood pressure affecting pregnancy in third trimester, antepartum   2. [redacted] weeks gestation of pregnancy   3. Sinus headache    -Discharge home in stable condition -Preeclampsia precautions discussed -Patient advised to follow-up with OB as scheduled for prenatal care on Thursday -Patient may return to MAU as needed or if her condition were to change or worsen   Wende Mott CNM 03/26/2021, 2:50 PM

## 2021-03-26 NOTE — MAU Note (Signed)
Sent from office for elevated b/p. Pt stated she has a headache but thinks it from allergies. No visual changes or abd pain reported. Good fetal movement felt.

## 2021-03-26 NOTE — Discharge Instructions (Signed)
Get help right away if: You have symptoms of serious complications, such as: Severe abdominal pain that does not get better with treatment. A severe headache that does not get better, blurred vision, or double vision. Vomiting that does not get better. Sudden, rapid weight gain or swelling in your hands, ankles, or face. Vaginal bleeding. Blood in your urine. Shortness of breath or chest pain. Weakness on one side of your body or difficulty speaking. Your baby is not moving as much as usual.

## 2021-03-26 NOTE — MAU Provider Note (Addendum)
History     CSN: [id number]  Arrival date and time: 03/26/21 1409   None     Chief Complaint  Patient presents with   Hypertension   Patient had elevated BP in office today of 152/90, 149/88, 142/87.  Patient here today for evaluation of gestational hypertension and monitoring for possible preeclampsia. She says that she has a headache in the front of her head for the past week which she says is relieved somewhat by allergy/sinus medication.  She does not take Tylenol at home.  Denies any vision changes, right upper quadrant pain, chest pain, or leg swelling/pain.  Has some dyspnea on exertion but overall is not worried by this and is similar to her previously in the third trimester.  +FM, denies vaginal bleeding or LOF, says contractions are not consistent  Has a history of cholelithiasis and was referred to general surgery has not had a follow-up scheduled with them yet but has called their office.  Denies any biliary colic pain currently or recently.  Has A2GDM as well on glyburide  Hypertension This is a new problem. The current episode started today. The problem has been gradually improving since onset. Associated symptoms include headaches.   OB History     Gravida  1   Para      Term      Preterm      AB      Living         SAB      IAB      Ectopic      Multiple      Live Births              Past Medical History:  Diagnosis Date   Gall stones    Gestational diabetes    Medical history non-contributory     Past Surgical History:  Procedure Laterality Date   NO PAST SURGERIES      Family History  Problem Relation Age of Onset   Arthritis Mother    Breast cancer Maternal Aunt    Diabetes Paternal Uncle    Dementia Maternal Grandmother    Mental illness Paternal Grandmother    Schizophrenia Paternal Grandmother     Social History   Tobacco Use   Smoking status: Never   Smokeless tobacco: Never  Vaping Use   Vaping Use: Never used   Substance Use Topics   Alcohol use: Not Currently   Drug use: No    Allergies:  Allergies  Allergen Reactions   Other Other (See Comments)    Pt requests no narcotic orders.  States she has no substance issues; she states she prefers to be cognizant of whether or not she is getting better and does not want her pain to be "muted."     Medications Prior to Admission  Medication Sig Dispense Refill Last Dose   cetirizine (ZYRTEC) 10 MG tablet Take 10 mg by mouth daily.   03/26/2021   fluticasone (FLONASE) 50 MCG/ACT nasal spray Place 2 sprays into both nostrils daily. 16 g 12 03/26/2021   glyBURIDE (DIABETA) 2.5 MG tablet Take 1 tablet (2.5 mg total) by mouth at bedtime. 30 tablet 3 03/25/2021   pantoprazole (PROTONIX) 20 MG tablet Take 1 tablet (20 mg total) by mouth daily. 30 tablet 1 03/25/2021   Prenatal Vit-Fe Fumarate-FA (MULTIVITAMIN-PRENATAL) 27-0.8 MG TABS tablet Take 1 tablet by mouth daily at 12 noon.   03/26/2021   Accu-Chek Softclix Lancets lancets Use as instructed to check  blood sugar 4 times daily 100 each 12    aspirin 81 MG chewable tablet Chew 2 tablets (162 mg total) by mouth daily. 60 tablet 7    Blood Glucose Monitoring Suppl (ACCU-CHEK GUIDE ME) w/Device KIT 1 each by Does not apply route 4 (four) times daily. 1 kit 0    Blood Pressure Monitor MISC For regular home bp monitoring during pregnancy 1 each 0    glucose blood (ACCU-CHEK GUIDE) test strip Use as instructed to check blood sugar 4 times daily 50 each 12    ondansetron (ZOFRAN-ODT) 4 MG disintegrating tablet Take 1 tablet (4 mg total) by mouth every 6 (six) hours as needed for nausea. (Patient not taking: Reported on 03/19/2021) 20 tablet 0    promethazine (PHENERGAN) 25 MG tablet Take 0.5-1 tablets (12.5-25 mg total) by mouth every 6 (six) hours as needed for nausea or vomiting. (Patient not taking: Reported on 02/09/2021) 30 tablet 0     Review of Systems  Constitutional:  Negative for fever.  Eyes:  Negative  for visual disturbance.  Gastrointestinal:  Negative for abdominal pain.  Neurological:  Positive for headaches.  Physical Exam   Blood pressure 124/76, pulse 83, temperature 98 F (36.7 C), resp. rate 18, height '5\' 3"'  (1.6 m), weight 105.2 kg, SpO2 99 %.  Patient Vitals for the past 24 hrs:  BP Temp Pulse Resp SpO2 Height Weight  03/26/21 1500 124/76 -- 83 -- 99 % -- --  03/26/21 1455 -- -- -- -- 98 % -- --  03/26/21 1450 -- -- -- -- 99 % -- --  03/26/21 1446 122/72 -- 90 -- -- -- --  03/26/21 1445 -- -- -- -- 99 % -- --  03/26/21 1443 133/70 -- 87 -- -- -- --  03/26/21 1422 137/77 98 F (36.7 C) 92 18 -- '5\' 3"'  (1.6 m) 105.2 kg     Physical Exam Constitutional:      General: She is not in acute distress.    Appearance: Normal appearance. She is normal weight. She is not ill-appearing or toxic-appearing.  HENT:     Head: Normocephalic and atraumatic.  Eyes:     Conjunctiva/sclera: Conjunctivae normal.  Cardiovascular:     Rate and Rhythm: Normal rate and regular rhythm.     Pulses: Normal pulses.     Heart sounds: Normal heart sounds. No murmur heard.   No friction rub. No gallop.  Pulmonary:     Effort: Pulmonary effort is normal. No respiratory distress.     Breath sounds: No wheezing or rales.  Abdominal:     General: Bowel sounds are normal.     Tenderness: There is no abdominal tenderness.  Musculoskeletal:        General: No tenderness.     Cervical back: Normal range of motion.     Comments: Trace lower extremity edema, non pitting  Skin:    Findings: No rash.  Neurological:     Mental Status: She is alert.   No results found.   Results for orders placed or performed during the hospital encounter of 03/26/21 (from the past 24 hour(s))  Protein / creatinine ratio, urine     Status: None   Collection Time: 03/26/21  2:38 PM  Result Value Ref Range   Creatinine, Urine 79.06 mg/dL   Total Protein, Urine 12 mg/dL   Protein Creatinine Ratio 0.15 0.00 - 0.15  mg/mg[Cre]  Urinalysis, Routine w reflex microscopic Urine, Clean Catch     Status:  Abnormal   Collection Time: 03/26/21  2:38 PM  Result Value Ref Range   Color, Urine YELLOW YELLOW   APPearance HAZY (A) CLEAR   Specific Gravity, Urine 1.017 1.005 - 1.030   pH 6.0 5.0 - 8.0   Glucose, UA NEGATIVE NEGATIVE mg/dL   Hgb urine dipstick NEGATIVE NEGATIVE   Bilirubin Urine NEGATIVE NEGATIVE   Ketones, ur NEGATIVE NEGATIVE mg/dL   Protein, ur NEGATIVE NEGATIVE mg/dL   Nitrite NEGATIVE NEGATIVE   Leukocytes,Ua NEGATIVE NEGATIVE     MAU Course  Procedures  MDM Plan AMA patient evaluated in the MAU for elevated blood pressures in clinic today. Pre-E labs obtained which showed P/C ratio of 0.15, CBC with normal platelets, and CMP with normal LFTs and creatinine.  Blood pressure ranges were all normotensive in MAU. Had likely sinus headache treated in MAU.  Assessment and Plan  #Elevated Blood Pressure in Third Trimester Was elevated in office and normotensive in MAU. Not gHTN currently. Asymptomatic except for sinus headache. -Monitor at f/u OB visit  #Sinus Headache Likely sinus headache given pressure around sinuses and nasal drainage. Says she took her allergy medication and flonase today. 1g tylenol did not help much in MAU. Flexeril offered but patient was not wanting anything for headache currently. No focal neurologic deficits and patient non-toxic on exam. BP normotensive and PreE labs wnl.  Samiha Denapoli 03/26/2021, 3:15 PM

## 2021-03-26 NOTE — Progress Notes (Signed)
° °  NURSE VISIT- NST  SUBJECTIVE:  Wanda Craig is a 26 y.o. G1P0 female at [redacted]w[redacted]d, here for a NST for pregnancy complicated by 123456 currently on Glyburide 2.5 mg qhs .  She reports active fetal movement, contractions: none, vaginal bleeding: none, membranes: intact.   OBJECTIVE:  BP (!) 142/87    Pulse 99    Wt 231 lb 6.4 oz (105 kg)    LMP  (LMP Unknown)    BMI 40.99 kg/m   Appears well, no apparent distress  Results for orders placed or performed in visit on 03/26/21 (from the past 24 hour(s))  POC Urinalysis Dipstick OB   Collection Time: 03/26/21 11:40 AM  Result Value Ref Range   Color, UA     Clarity, UA     Glucose, UA Large (3+) (A) Negative   Bilirubin, UA     Ketones, UA neg    Spec Grav, UA     Blood, UA neg    pH, UA     POC,PROTEIN,UA Trace Negative, Trace, Small (1+), Moderate (2+), Large (3+), 4+   Urobilinogen, UA     Nitrite, UA neg    Leukocytes, UA Negative Negative   Appearance     Odor      NST: FHR baseline 145 bpm, Variability: moderate, Accelerations:present, Decelerations:  Absent= Cat 1/reactive Toco: none   ASSESSMENT: G1P0 at [redacted]w[redacted]d with A2DM currently on Glyburide 2.5 mg qhs NST reactive  PLAN: EFM strip interpreted and reviewed by Knute Neu, CNM, [facility]   Recommendations: send to [facility] MAU  due to elevated BP readings at 1230 notified    Hasheem Voland A Juwuan Sedita  03/26/2021 12:32 PM   Chart reviewed for nurse visit. Agree with plan of care. To [facility] for pre-e eval, notified S.Jeronimo Greaves, SNM and Dr. Mal Amabile, Royetta Crochet, CNM 03/27/2021 8:51 AM

## 2021-03-27 LAB — CULTURE, BETA STREP (GROUP B ONLY): Strep Gp B Culture: NEGATIVE

## 2021-03-29 ENCOUNTER — Ambulatory Visit (INDEPENDENT_AMBULATORY_CARE_PROVIDER_SITE_OTHER): Payer: Medicaid Other | Admitting: Obstetrics & Gynecology

## 2021-03-29 ENCOUNTER — Other Ambulatory Visit: Payer: Self-pay

## 2021-03-29 ENCOUNTER — Encounter (HOSPITAL_COMMUNITY): Payer: Self-pay | Admitting: Obstetrics & Gynecology

## 2021-03-29 VITALS — BP 146/91 | HR 111 | Wt 233.0 lb

## 2021-03-29 DIAGNOSIS — O24419 Gestational diabetes mellitus in pregnancy, unspecified control: Secondary | ICD-10-CM

## 2021-03-29 DIAGNOSIS — O133 Gestational [pregnancy-induced] hypertension without significant proteinuria, third trimester: Secondary | ICD-10-CM

## 2021-03-29 DIAGNOSIS — O0993 Supervision of high risk pregnancy, unspecified, third trimester: Secondary | ICD-10-CM

## 2021-03-29 LAB — POCT URINALYSIS DIPSTICK OB
Blood, UA: NEGATIVE
Glucose, UA: NEGATIVE
Ketones, UA: NEGATIVE
Leukocytes, UA: NEGATIVE
Nitrite, UA: NEGATIVE

## 2021-03-29 NOTE — Progress Notes (Signed)
   HIGH-RISK PREGNANCY VISIT Patient name: Wanda Craig MRN [id number]  Date of birth: [birth year] Chief Complaint:   Routine Prenatal Visit, High Risk Gestation, and Non-stress Test  History of Present Illness:   Wanda Craig is a 26 y.o. G1P0 female at [redacted]w[redacted]d with an Estimated Date of Delivery: 04/15/21 being seen today for ongoing management of a high-risk pregnancy complicated by 123456 + GHTN, just diagnosed.    Today she reports no complaints. Contractions: Irritability. Vag. Bleeding: None.  Movement: Present. denies leaking of fluid.      10/04/2020    9:43 AM 08/29/2020    3:07 PM  Depression screen PHQ 2/9  Decreased Interest 0 0  Down, Depressed, Hopeless 0 0  PHQ - 2 Score 0 0  Altered sleeping 0   Tired, decreased energy 1   Change in appetite 0   Feeling bad or failure about yourself  0   Trouble concentrating 0   Moving slowly or fidgety/restless 0   Suicidal thoughts 0   PHQ-9 Score 1         10/04/2020    9:43 AM  GAD 7 : Generalized Anxiety Score  Nervous, Anxious, on Edge 0  Control/stop worrying 0  Worry too much - different things 0  Trouble relaxing 0  Restless 0  Easily annoyed or irritable 0  Afraid - awful might happen 0  Total GAD 7 Score 0     Review of Systems:   Pertinent items are noted in HPI Denies abnormal vaginal discharge w/ itching/odor/irritation, headaches, visual changes, shortness of breath, chest pain, abdominal pain, severe nausea/vomiting, or problems with urination or bowel movements unless otherwise stated above. Pertinent History Reviewed:  Reviewed past medical,surgical, social, obstetrical and family history.  Reviewed problem list, medications and allergies. Physical Assessment:   Vitals:   03/29/21 1116  BP: (!) 146/91  Pulse: (!) 111  Weight: 233 lb (105.7 kg)  Body mass index is 41.27 kg/m.           Physical Examination:   General appearance: alert, well appearing, and in no distress  Mental status: alert,  oriented to person, place, and time  Skin: warm & dry   Extremities: Edema: Trace    Cardiovascular: normal heart rate noted  Respiratory: normal respiratory effort, no distress  Abdomen: gravid, soft, non-tender  Pelvic: Cervical exam deferred         Fetal Status:     Movement: Present    Fetal Surveillance Testing today: reactive NST   Chaperone: N/A    No results found for this or any previous visit (from the past 24 hour(s)).   Assessment & Plan:  High-risk pregnancy: G1P0 at [redacted]w[redacted]d with an Estimated Date of Delivery: 04/15/21     ICD-10-CM   1. High-risk pregnancy in third trimester  O09.93 POC Urinalysis Dipstick OB    2. Gestational diabetes mellitus, class A2  O24.419     3. Gestational hypertension, third trimester  O13.3         Meds: No orders of the defined types were placed in this encounter.   Labs/procedures today: NST  Treatment Plan:  IOL tomorrow for GHT + A2DM    Follow-up: Return for BP check next week.   No future appointments.   Orders Placed This Encounter  Procedures   POC Urinalysis Dipstick OB   Florian Buff  10/24/2021 8:43 PM

## 2021-03-30 ENCOUNTER — Inpatient Hospital Stay (HOSPITAL_COMMUNITY)
Admission: AD | Admit: 2021-03-30 | Discharge: 2021-04-03 | DRG: 806 | Disposition: A | Discharge status: Home / Self Care | Payer: Medicaid Other | Attending: Obstetrics and Gynecology | Admitting: Obstetrics and Gynecology

## 2021-03-30 ENCOUNTER — Encounter (HOSPITAL_COMMUNITY): Payer: Self-pay | Admitting: Obstetrics & Gynecology

## 2021-03-30 ENCOUNTER — Inpatient Hospital Stay (HOSPITAL_COMMUNITY): Payer: Medicaid Other

## 2021-03-30 DIAGNOSIS — Z20822 Contact with and (suspected) exposure to covid-19: Secondary | ICD-10-CM | POA: Diagnosis present

## 2021-03-30 DIAGNOSIS — O134 Gestational [pregnancy-induced] hypertension without significant proteinuria, complicating childbirth: Secondary | ICD-10-CM | POA: Diagnosis present

## 2021-03-30 DIAGNOSIS — O24419 Gestational diabetes mellitus in pregnancy, unspecified control: Secondary | ICD-10-CM

## 2021-03-30 DIAGNOSIS — Z3A37 37 weeks gestation of pregnancy: Secondary | ICD-10-CM | POA: Diagnosis not present

## 2021-03-30 DIAGNOSIS — K8020 Calculus of gallbladder without cholecystitis without obstruction: Secondary | ICD-10-CM | POA: Diagnosis present

## 2021-03-30 DIAGNOSIS — D62 Acute posthemorrhagic anemia: Secondary | ICD-10-CM | POA: Diagnosis not present

## 2021-03-30 DIAGNOSIS — O24425 Gestational diabetes mellitus in childbirth, controlled by oral hypoglycemic drugs: Secondary | ICD-10-CM | POA: Diagnosis present

## 2021-03-30 DIAGNOSIS — O26893 Other specified pregnancy related conditions, third trimester: Secondary | ICD-10-CM | POA: Diagnosis present

## 2021-03-30 DIAGNOSIS — O99214 Obesity complicating childbirth: Secondary | ICD-10-CM | POA: Diagnosis present

## 2021-03-30 DIAGNOSIS — O9962 Diseases of the digestive system complicating childbirth: Secondary | ICD-10-CM | POA: Diagnosis not present

## 2021-03-30 DIAGNOSIS — O139 Gestational [pregnancy-induced] hypertension without significant proteinuria, unspecified trimester: Secondary | ICD-10-CM

## 2021-03-30 DIAGNOSIS — Z3A38 38 weeks gestation of pregnancy: Secondary | ICD-10-CM | POA: Diagnosis not present

## 2021-03-30 DIAGNOSIS — Z6791 Unspecified blood type, Rh negative: Secondary | ICD-10-CM | POA: Diagnosis not present

## 2021-03-30 DIAGNOSIS — Z8632 Personal history of gestational diabetes: Secondary | ICD-10-CM

## 2021-03-30 DIAGNOSIS — Z8759 Personal history of other complications of pregnancy, childbirth and the puerperium: Secondary | ICD-10-CM

## 2021-03-30 DIAGNOSIS — O2662 Liver and biliary tract disorders in childbirth: Secondary | ICD-10-CM | POA: Diagnosis present

## 2021-03-30 DIAGNOSIS — O9081 Anemia of the puerperium: Secondary | ICD-10-CM | POA: Diagnosis not present

## 2021-03-30 DIAGNOSIS — O26899 Other specified pregnancy related conditions, unspecified trimester: Secondary | ICD-10-CM

## 2021-03-30 DIAGNOSIS — O0990 Supervision of high risk pregnancy, unspecified, unspecified trimester: Secondary | ICD-10-CM

## 2021-03-30 LAB — GLUCOSE, CAPILLARY
Glucose-Capillary: 124 mg/dL — ABNORMAL HIGH (ref 70–99)
Glucose-Capillary: 107 mg/dL — ABNORMAL HIGH (ref 70–99)
Glucose-Capillary: 95 mg/dL (ref 70–99)

## 2021-03-30 LAB — TYPE AND SCREEN
ABO/RH(D): O NEG
Antibody Screen: POSITIVE

## 2021-03-30 LAB — CBC
HCT: 35.2 % — ABNORMAL LOW (ref 36.0–46.0)
Hemoglobin: 11.6 g/dL — ABNORMAL LOW (ref 12.0–15.0)
MCH: 29.7 pg (ref 26.0–34.0)
MCHC: 33 g/dL (ref 30.0–36.0)
MCV: 90 fL (ref 80.0–100.0)
Platelets: 248 10*3/uL (ref 150–400)
RBC: 3.91 MIL/uL (ref 3.87–5.11)
RDW: 13.3 % (ref 11.5–15.5)
WBC: 8.6 10*3/uL (ref 4.0–10.5)
nRBC: 0 % (ref 0.0–0.2)

## 2021-03-30 LAB — COMPREHENSIVE METABOLIC PANEL
ALT: 14 U/L (ref 0–44)
AST: 20 U/L (ref 15–41)
Albumin: 2.6 g/dL — ABNORMAL LOW (ref 3.5–5.0)
Alkaline Phosphatase: 165 U/L — ABNORMAL HIGH (ref 38–126)
Anion gap: 10 (ref 5–15)
BUN: 10 mg/dL (ref 6–20)
CO2: 19 mmol/L — ABNORMAL LOW (ref 22–32)
Calcium: 8.9 mg/dL (ref 8.9–10.3)
Chloride: 105 mmol/L (ref 98–111)
Creatinine, Ser: 0.53 mg/dL (ref 0.44–1.00)
GFR, Estimated: >60 mL/min (ref >60)
Glucose, Bld: 91 mg/dL (ref 70–99)
Potassium: 3.9 mmol/L (ref 3.5–5.1)
Sodium: 134 mmol/L — ABNORMAL LOW (ref 135–145)
Total Bilirubin: 0.1 mg/dL — ABNORMAL LOW (ref 0.3–1.2)
Total Protein: 6.1 g/dL — ABNORMAL LOW (ref 6.5–8.1)

## 2021-03-30 LAB — PROTEIN / CREATININE RATIO, URINE
Creatinine, Urine: 75.22 mg/dL
Protein Creatinine Ratio: 0.12 mg/mg{Cre} (ref 0.00–0.15)
Total Protein, Urine: 9 mg/dL

## 2021-03-30 LAB — RPR: RPR Ser Ql: NONREACTIVE

## 2021-03-30 MED ORDER — LIDOCAINE HCL (PF) 1 % IJ SOLN: 30.0000 mL | INTRAMUSCULAR | Status: DC | PRN

## 2021-03-30 MED ORDER — MISOPROSTOL 25 MCG QUARTER TABLET
25.0000 ug | ORAL_TABLET | ORAL | Status: DC
  Administered 2021-03-30: 25 ug via VAGINAL
  Filled 2021-03-30: qty 1

## 2021-03-30 MED ORDER — OXYTOCIN BOLUS FROM INFUSION
333.0000 mL | Freq: Once | INTRAVENOUS | Status: AC
  Administered 2021-04-01: 333 mL via INTRAVENOUS

## 2021-03-30 MED ORDER — SOD CITRATE-CITRIC ACID 500-334 MG/5ML PO SOLN: 30.0000 mL | ORAL | Status: DC | PRN

## 2021-03-30 MED ORDER — LACTATED RINGERS IV SOLN: INTRAVENOUS | Status: DC

## 2021-03-30 MED ORDER — FLEET ENEMA 7-19 GM/118ML RE ENEM: 1.0000 | ENEMA | RECTAL | Status: DC | PRN

## 2021-03-30 MED ORDER — ACETAMINOPHEN 325 MG PO TABS
650.0000 mg | ORAL_TABLET | ORAL | Status: DC | PRN
  Administered 2021-03-31 – 2021-04-01 (×3): 650 mg via ORAL
  Filled 2021-03-30 (×3): qty 2

## 2021-03-30 MED ORDER — LACTATED RINGERS IV SOLN
500.0000 mL | INTRAVENOUS | Status: DC | PRN
  Administered 2021-04-01: 500 mL via INTRAVENOUS

## 2021-03-30 MED ORDER — OXYCODONE-ACETAMINOPHEN 5-325 MG PO TABS: 1.0000 | ORAL_TABLET | ORAL | Status: DC | PRN

## 2021-03-30 MED ORDER — TERBUTALINE SULFATE 1 MG/ML IJ SOLN: 0.2500 mg | Freq: Once | INTRAMUSCULAR | Status: DC | PRN

## 2021-03-30 MED ORDER — MISOPROSTOL 50MCG HALF TABLET
50.0000 ug | ORAL_TABLET | ORAL | Status: DC | PRN
  Administered 2021-03-30 (×2): 50 ug via ORAL
  Filled 2021-03-30 (×3): qty 1

## 2021-03-30 MED ORDER — OXYCODONE-ACETAMINOPHEN 5-325 MG PO TABS: 2.0000 | ORAL_TABLET | ORAL | Status: DC | PRN

## 2021-03-30 MED ORDER — FENTANYL CITRATE (PF) 100 MCG/2ML IJ SOLN
100.0000 ug | INTRAMUSCULAR | Status: DC | PRN
  Administered 2021-03-30 – 2021-03-31 (×5): 100 ug via INTRAVENOUS
  Filled 2021-03-30 (×5): qty 2

## 2021-03-30 MED ORDER — ONDANSETRON HCL 4 MG/2ML IJ SOLN
4.0000 mg | Freq: Four times a day (QID) | INTRAMUSCULAR | Status: DC | PRN
  Administered 2021-03-30 – 2021-03-31 (×3): 4 mg via INTRAVENOUS
  Filled 2021-03-30 (×3): qty 2

## 2021-03-30 MED ORDER — OXYTOCIN-SODIUM CHLORIDE 30-0.9 UT/500ML-% IV SOLN
2.5000 [IU]/h | INTRAVENOUS | Status: DC
  Filled 2021-03-30 (×2): qty 500

## 2021-03-30 NOTE — Progress Notes (Signed)
Wanda Craig is a 26 y.o. G1P0 at [redacted]w[redacted]d admitted for induction of labor due to gHTN and A2GDM (on Glyburide). ? ?Subjective: ?Reports feeling ok. [place] with foley balloon placement at this check if appropriate. ? ?Objective: ?BP 131/71   Pulse 84   Temp (!) 97.5 ?F (36.4 ?C) (Oral)   Resp 16   Ht 5\' 3"  (1.6 m)   Wt 106.4 kg   LMP  (LMP Unknown)   BMI 41.56 kg/m?  ?No intake/output data recorded. ?No intake/output data recorded. ? ?FHT:  FHR: 130 bpm, variability: moderate,  accelerations:  Present,  decelerations:  Absent ?UC:   regular, every 1-2 minutes ?SVE:   Dilation: 1.5 ?Effacement (%): 40, 50 ?Station: -2, -3 ?Exam by:: Dr. Renard Matter ? ?Labs: ?Lab Results  ?Component Value Date  ? WBC 8.6 03/30/2021  ? HGB 11.6 (L) 03/30/2021  ? HCT 35.2 (L) 03/30/2021  ? MCV 90.0 03/30/2021  ? PLT 248 03/30/2021  ? ? ?Assessment / Plan: ? G1P0 at [redacted]w[redacted]d admitted for induction of labor due to gHTN and A2GDM (on Glyburide). ? ?Labor:  cervix 1-1.5 cm, foley balloon placed during this check. Tolerated well by patient and fetus. Contracting frequently with balloon. Once space out can give another dose of Cytotec (buccal). ? ?Fetal Wellbeing:  Category I ?I/D:   GBS neg ? ? ?#gHTN ?Mostly mild range. Had one elevated in 156/75. Asymptomatic. Will continue to monitor. ? ?#A2GDM ?Outpatient on Glyburide. Here q4hr checks while in early labor have been within appropriate range. Will continue q4hr checks and then switch to q2hr once in active labor.  ? ?Renard Matter ?03/30/2021, 10:29 PM ? ? ?

## 2021-03-30 NOTE — Progress Notes (Signed)
Labor Progress Note ?Wanda Craig is a 26 y.o. G1P0 at [redacted]w[redacted]d presented for IOL due to A2GDM. ? ?S: Resting comfortably in bed, feeling occasional contractions.  ? ?O:  ?BP 119/64   Pulse 79   Temp 98 ?F (36.7 ?C) (Oral)   Resp 16   Ht 5\' 3"  (1.6 m)   Wt 106.4 kg   LMP  (LMP Unknown)   BMI 41.56 kg/m?  ?EFM: Baseline FHR 130 bpm/moderate variability/+accels, no decels ? ?CVE: Dilation: 1 ?Effacement (%): 40 ?Cervical Position: Posterior ?Station: -3 ?Presentation: Vertex ?Exam by:: Dr Tona Sensing ? ? ?A&P: 26 y.o. G1P0 [redacted]w[redacted]d  ?#Labor: Progressing well. S/p x2 buccal cytotec. Will trial vaginal cytotec. Encouraged ambulation and using exercise ball. Consider FB placement when able. Will reassess in 4 hours. ?#Pain: PRN ?#FWB: Cat 1 ?#GBS negative ? ?#A2GDM: CBG stable. Continue q4h glucose checks.  ? ?#gHTN: Mild range BP. Asymptomatic. Will continue to monitor.  ? ? ?Karsten Fells, DO PGY-1 ?03/30/2021, 5:15 PM ? ? ?

## 2021-03-30 NOTE — Progress Notes (Signed)
Labor Progress Note ?Wanda Craig is a 26 y.o. G1P0 at [redacted]w[redacted]d presented for IOL due to A2GDM and gHTN. ? ?S: Resting comfortably in bed, partner at bedside. No concerns at this time. ? ?O:  ?BP (!) 148/96   Pulse 93   Temp 98.3 ?F (36.8 ?C) (Oral)   Resp 16   Ht 5\' 3"  (1.6 m)   Wt 106.4 kg   LMP  (LMP Unknown)   BMI 41.56 kg/m?  ?EFM: Baseline FHR 135/moderate variability/+accels, no decels ? ?CVE: Dilation: Closed ?Effacement (%): Thick ?Cervical Position: Posterior ?Station: -2 ?Presentation: Vertex ?Exam by:: Dr. [email or web address] ? ? ?A&P: 26 y.o. G1P0 [redacted]w[redacted]d  ?#Labor: S/p x1 cytotec. Will continue with additional buccal cytotec for cervical ripening. Plan for FB placement when able. Will reassess in 4 hours. ?#Pain: PRN ?#FWB: Cat 1 ?#GBS negative ? ?#A2GDM: CBG stable. Continue q4h glucose checks.  ? ?#gHTN: Mild range BP. Remains asymptomatic. Will continue to monitor.  ? ?[redacted]w[redacted]d, DO PGY-1 ?03/30/2021, 12:45 PM ? ? ?

## 2021-03-30 NOTE — Progress Notes (Signed)
Inpatient Diabetes Program Recommendations ? ?ADA Standards of Care 2021 ?Diabetes in Pregnancy Target Glucose Ranges: ? ?Fasting: 60 - 90 mg/dL ?Preprandial: 60 - 105 mg/dL ?1 hr postprandial: Less than 140mg /dL (from first bite of meal) ?2 hr postprandial: Less than 120 mg/dL (from first bit of meal) ?  ? ?No results found for: GLUCAP, HGBA1C ? ?Review of Glycemic Control ? Latest Reference Range & Units 03/30/21 07:15  ?Glucose 70 - 99 mg/dL 91  ? ? ?Diabetes history: GDMA2 ?Outpatient Diabetes medications: Glyburide 2.5 mg QHS ?Current orders for Inpatient glycemic control: CBGs Q4H ? ?Received referral for DM management.  Current serum is 91 mg/dL.  Will follow closely.   ? ? ?Thank you, ?05/30/21, MSN, RN ?Diabetes Coordinator ?Inpatient Diabetes Program ?[phone] (team pager from 8a-5p) ? ? ? ? ? ?

## 2021-03-30 NOTE — H&P (Addendum)
OBSTETRIC ADMISSION HISTORY AND PHYSICAL  Wanda Craig is a 26 y.o. female G1P0 with IUP at 29w5dby 10-wk [place] for IOL due to A2GDM (on glyburide) and recently diagnosed gHTN. She reports +FMs, no LOF, no VB, no blurry vision, headaches, peripheral edema, or RUQ pain.  She plans on breast feeding. She is undecided on contraception postpartum, considering PoPs.  [facility]  Dating: By 10-wk [place]--->  Estimated Date of Delivery: 04/15/21  Sono:   _0 , CWD, normal anatomy, cephalic presentation, anterior placental lie, 2953 g, 80% EFW   Prenatal History/Complications:  -AP1WCH(on glyburide) -gHTN -Rh negative -Cholelithiasis   Past Medical History: Past Medical History:  Diagnosis Date   Gall stones    Gestational diabetes     Past Surgical History: Past Surgical History:  Procedure Laterality Date   NO PAST SURGERIES      Obstetrical History: OB History     Gravida  1   Para      Term      Preterm      AB      Living         SAB      IAB      Ectopic      Multiple      Live Births              Social History Social History   Socioeconomic History   Marital status: Single    Spouse name: Not on file   Number of children: Not on file   Years of education: Not on file   Highest education level: Not on file  Occupational History   Not on file  Tobacco Use   Smoking status: Never   Smokeless tobacco: Never  Vaping Use   Vaping Use: Never used  Substance and Sexual Activity   Alcohol use: Not Currently   Drug use: No   Sexual activity: Yes    Birth control/protection: None  Other Topics Concern   Not on file  Social History Narrative   Not on file   Social Determinants of Health   Financial Resource Strain: Low Risk    Difficulty of Paying Living Expenses: Not hard at all  Food Insecurity: No Food Insecurity   Worried About RCharity fundraiserin the Last Year: Never true   Ran Out  of Food in the Last Year: Never true  Transportation Needs: No Transportation Needs   Lack of Transportation (Medical): No   Lack of Transportation (Non-Medical): No  Physical Activity: Inactive   Days of Exercise per Week: 0 days   Minutes of Exercise per Session: 0 min  Stress: No Stress Concern Present   Feeling of Stress : Only a little  Social Connections: Socially Isolated   Frequency of Communication with Friends and Family: More than three times a week   Frequency of Social Gatherings with Friends and Family: Twice a week   Attends Religious Services: Never   AMarine scientistor Organizations: No   Attends CMusic therapist Never   Marital Status: Never married    Family History: Family History  Problem Relation Age of Onset   Arthritis Mother    Breast cancer Maternal Aunt    Diabetes Paternal Uncle    Dementia Maternal Grandmother    Mental illness Paternal Grandmother    Schizophrenia Paternal Grandmother     Allergies: Allergies  Allergen Reactions  Other Other (See Comments)    Pt requests no narcotic orders.  States she has no substance issues; she states she prefers to be cognizant of whether or not she is getting better and does not want her pain to be "muted."     Medications Prior to Admission  Medication Sig Dispense Refill Last Dose   fluticasone (FLONASE) 50 MCG/ACT nasal spray Place 2 sprays into both nostrils daily. 16 g 12 03/29/2021   glyBURIDE (DIABETA) 2.5 MG tablet Take 1 tablet (2.5 mg total) by mouth at bedtime. 30 tablet 3 03/29/2021   pantoprazole (PROTONIX) 20 MG tablet Take 1 tablet (20 mg total) by mouth daily. 30 tablet 1 03/29/2021   Accu-Chek Softclix Lancets lancets Use as instructed to check blood sugar 4 times daily 100 each 12    acetaminophen (TYLENOL) 500 MG tablet Take 500 mg by mouth every 6 (six) hours as needed.      aspirin 81 MG chewable tablet Chew 2 tablets (162 mg total) by mouth daily. 60 tablet 7    Blood  Glucose Monitoring Suppl (ACCU-CHEK GUIDE ME) w/Device KIT 1 each by Does not apply route 4 (four) times daily. 1 kit 0    Blood Pressure Monitor MISC For regular home bp monitoring during pregnancy 1 each 0    cetirizine (ZYRTEC) 10 MG tablet Take 10 mg by mouth daily.      glucose blood (ACCU-CHEK GUIDE) test strip Use as instructed to check blood sugar 4 times daily 50 each 12    ondansetron (ZOFRAN-ODT) 4 MG disintegrating tablet Take 1 tablet (4 mg total) by mouth every 6 (six) hours as needed for nausea. (Patient not taking: Reported on 03/19/2021) 20 tablet 0    Prenatal Vit-Fe Fumarate-FA (MULTIVITAMIN-PRENATAL) 27-0.8 MG TABS tablet Take 1 tablet by mouth daily at 12 noon.      promethazine (PHENERGAN) 25 MG tablet Take 0.5-1 tablets (12.5-25 mg total) by mouth every 6 (six) hours as needed for nausea or vomiting. (Patient not taking: Reported on 02/09/2021) 30 tablet 0      Review of Systems   All systems reviewed and negative except as stated in HPI  Blood pressure 133/80, pulse 77, temperature 98.3 F (36.8 C), temperature source Oral, resp. rate 16, height _0  (1.6 m), weight 106.4 kg. General appearance: alert, cooperative, and no distress Lungs: normal work of breathing on room air Heart: regular rate, warm and well-perfused Abdomen: soft, non-tender; gravid Extremities: no LE edema, no calf tenderness to palpation  Presentation: cephalic per RN Fetal monitoring: Baseline FHR 130 bpm/moderate variability/+accels, no decels Uterine activity: None Dilation: Closed (Dimple) Effacement (%): Thick Station: -2 Exam by:: A. Durene Romans, RN   Prenatal labs: ABO, Rh: --/--/O NEG (03/03 0715) Antibody: POS (03/03 0715) Rubella: 6.45 (09/07 1126) RPR: Non Reactive (12/27 0948)  HBsAg: Negative (09/07 1126)  HIV: Non Reactive (12/27 0948)  GBS: Negative/-- (02/23 1556)  2 hr Glucola: Abnormal, A2GDM Genetic screening: LR NIPS, Panorama LR Anatomy US: Normal  Prenatal Transfer  Tool  Maternal Diabetes: Yes:  Diabetes Type:  Insulin/Medication controlled  Genetic Screening: Normal Maternal Ultrasounds/Referrals: Normal Fetal Ultrasounds or other Referrals:  None Maternal Substance Abuse:  No Significant Maternal Medications:  Meds include: Other: Glyburide 2.5 mg Significant Maternal Lab Results: Group B Strep negative and Rh negative  Results for orders placed or performed during the hospital encounter of 03/30/21 (from the past 24 hour(s))  CBC   Collection Time: 03/30/21  7:15 AM  Result Value  Ref Range   WBC 8.6 4.0 - 10.5 K/uL   RBC 3.91 3.87 - 5.11 MIL/uL   Hemoglobin 11.6 (L) 12.0 - 15.0 g/dL   HCT 35.2 (L) 36.0 - 46.0 %   MCV 90.0 80.0 - 100.0 fL   MCH 29.7 26.0 - 34.0 pg   MCHC 33.0 30.0 - 36.0 g/dL   RDW 13.3 11.5 - 15.5 %   Platelets 248 150 - 400 K/uL   nRBC 0.0 0.0 - 0.2 %  Comprehensive metabolic panel   Collection Time: 03/30/21  7:15 AM  Result Value Ref Range   Sodium 134 (L) 135 - 145 mmol/L   Potassium 3.9 3.5 - 5.1 mmol/L   Chloride 105 98 - 111 mmol/L   CO2 19 (L) 22 - 32 mmol/L   Glucose, Bld 91 70 - 99 mg/dL   BUN 10 6 - 20 mg/dL   Creatinine, Ser 0.53 0.44 - 1.00 mg/dL   Calcium 8.9 8.9 - 10.3 mg/dL   Total Protein 6.1 (L) 6.5 - 8.1 g/dL   Albumin 2.6 (L) 3.5 - 5.0 g/dL   AST 20 15 - 41 U/L   ALT 14 0 - 44 U/L   Alkaline Phosphatase 165 (H) 38 - 126 U/L   Total Bilirubin 0.1 (L) 0.3 - 1.2 mg/dL   GFR, Estimated >60 >60 mL/min   Anion gap 10 5 - 15  Type and screen [place]   Collection Time: 03/30/21  7:15 AM  Result Value Ref Range   ABO/RH(D) O NEG    Antibody Screen POS    Sample Expiration 04/02/2021,2359    Antibody Identification      PASSIVELY ACQUIRED ANTI-D Performed at [facility], [address] [place], [place] [address]   Results for orders placed or performed in visit on 03/29/21 (from the past 24 hour(s))  POC Urinalysis Dipstick OB   Collection Time: 03/29/21 11:19 AM   Result Value Ref Range   Color, UA     Clarity, UA     Glucose, UA Negative Negative   Bilirubin, UA     Ketones, UA neg    Spec Grav, UA     Blood, UA neg    pH, UA     POC,PROTEIN,UA Trace Negative, Trace, Small (1+), Moderate (2+), Large (3+), 4+   Urobilinogen, UA     Nitrite, UA neg    Leukocytes, UA Negative Negative   Appearance     Odor      Patient Active Problem List   Diagnosis Date Noted   Supervision of high-risk pregnancy 03/30/2021   Gestational diabetes mellitus, class A2 02/19/2021   Cholelithiases 01/11/2021   Rh negative state in antepartum period 10/11/2020   Supervision of high risk pregnancy, antepartum 10/03/2020    Assessment/Plan:  KENOSHA DOSTER is a 26 y.o. G1P0 at 81w5dhere for IOL due to A2GDM and gHTN.  #Labor: Will initiate induction with buccal cytotec for cervical ripening. Will consider foley balloon placement when able. Plan to reassess in 4 hours.  #Pain: PRN #FWB: Cat 1 #ID: GBS negative #MOF: Breast #MOC: Undecided, considering PoPs #Circ:  Yes, inpatient.   #A2GDM: EFW 2953 g, 80% at 312w2dCBG 91 on admission. Will hold home glyburide 2.5 mg and monitor q4h glucose checks while in latent phase.   #gHTN: Recently diagnosed this past week. Mild range Bps. Asymptomatic. CBC and CMP wnl on admission. Normal P/C ratio on 03/26/21. Will continue to monitor.   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 Will evaluate for Rhogam administration postpartum.  Karsten Fells, DO PGY-1 03/30/2021, 9:51 AM  GME ATTESTATION:  I saw and evaluated the patient. I agree with the findings and the plan of care as documented in the residents note.  Darrelyn Hillock, DO OB Fellow, [place] [facility] [place] 03/30/2021 1:01 PM

## 2021-03-31 ENCOUNTER — Inpatient Hospital Stay (HOSPITAL_COMMUNITY): Payer: Medicaid Other | Admitting: Anesthesiology

## 2021-03-31 LAB — GLUCOSE, CAPILLARY
Glucose-Capillary: 100 mg/dL — ABNORMAL HIGH (ref 70–99)
Glucose-Capillary: 106 mg/dL — ABNORMAL HIGH (ref 70–99)
Glucose-Capillary: 114 mg/dL — ABNORMAL HIGH (ref 70–99)
Glucose-Capillary: 102 mg/dL — ABNORMAL HIGH (ref 70–99)
Glucose-Capillary: 96 mg/dL (ref 70–99)
Glucose-Capillary: 106 mg/dL — ABNORMAL HIGH (ref 70–99)
Glucose-Capillary: 107 mg/dL — ABNORMAL HIGH (ref 70–99)

## 2021-03-31 LAB — CBC
HCT: 38.7 % (ref 36.0–46.0)
Hemoglobin: 12.8 g/dL (ref 12.0–15.0)
MCH: 29.8 pg (ref 26.0–34.0)
MCHC: 33.1 g/dL (ref 30.0–36.0)
MCV: 90.2 fL (ref 80.0–100.0)
Platelets: 228 10*3/uL (ref 150–400)
RBC: 4.29 MIL/uL (ref 3.87–5.11)
RDW: 13.3 % (ref 11.5–15.5)
WBC: 16.4 10*3/uL — ABNORMAL HIGH (ref 4.0–10.5)
nRBC: 0 % (ref 0.0–0.2)

## 2021-03-31 LAB — RESP PANEL BY RT-PCR (FLU A&B, COVID) ARPGX2
Influenza A by PCR: NEGATIVE
Influenza B by PCR: NEGATIVE
SARS Coronavirus 2 by RT PCR: NEGATIVE

## 2021-03-31 MED ORDER — TERBUTALINE SULFATE 1 MG/ML IJ SOLN: 0.2500 mg | Freq: Once | INTRAMUSCULAR | Status: DC | PRN

## 2021-03-31 MED ORDER — DIPHENHYDRAMINE HCL 50 MG/ML IJ SOLN: 12.5000 mg | INTRAMUSCULAR | Status: DC | PRN

## 2021-03-31 MED ORDER — LIDOCAINE-EPINEPHRINE (PF) 2 %-1:200000 IJ SOLN
INTRAMUSCULAR | Status: DC | PRN
  Administered 2021-03-31: 5 mL via EPIDURAL

## 2021-03-31 MED ORDER — EPHEDRINE 5 MG/ML INJ: 10.0000 mg | INTRAVENOUS | Status: DC | PRN

## 2021-03-31 MED ORDER — PHENYLEPHRINE 40 MCG/ML (10ML) SYRINGE FOR IV PUSH (FOR BLOOD PRESSURE SUPPORT): 80.0000 ug | PREFILLED_SYRINGE | INTRAVENOUS | Status: DC | PRN

## 2021-03-31 MED ORDER — OXYTOCIN-SODIUM CHLORIDE 30-0.9 UT/500ML-% IV SOLN
1.0000 m[IU]/min | INTRAVENOUS | Status: DC
  Administered 2021-03-31: 2 m[IU]/min via INTRAVENOUS
  Filled 2021-03-31: qty 500

## 2021-03-31 MED ORDER — LACTATED RINGERS IV SOLN
500.0000 mL | Freq: Once | INTRAVENOUS | Status: AC
  Administered 2021-03-31: 500 mL via INTRAVENOUS

## 2021-03-31 MED ORDER — FENTANYL-BUPIVACAINE-NACL 0.5-0.125-0.9 MG/250ML-% EP SOLN
12.0000 mL/h | EPIDURAL | Status: DC | PRN
  Administered 2021-03-31 – 2021-04-01 (×2): 12 mL/h via EPIDURAL
  Filled 2021-03-31 (×2): qty 250

## 2021-03-31 NOTE — Progress Notes (Signed)
Wanda Craig is a 26 y.o. G1P0 at [redacted]w[redacted]d admitted for induction of labor due to gHTN and A2GDM (on Glyburide). ? ?Subjective: ?Feels constant cramping currently otherwise okay ? ?Objective: ?BP 112/77   Pulse 96   Temp (!) 97.5 ?F (36.4 ?C) (Oral)   Resp 16   Ht 5\' 3"  (1.6 m)   Wt 106.4 kg   LMP  (LMP Unknown)   BMI 41.56 kg/m?  ?No intake/output data recorded. ?No intake/output data recorded. ? ?FHT:  FHR: 140 bpm, variability: moderate,  accelerations:  Present,  decelerations:  Absent ?UC:   occasional, not picking up much  ?SVE:   Dilation: 3 ?Effacement (%): 50, 60 ?Station: -2 ?Exam by:: Dr. [email or web address] ? ?Labs: ?Lab Results  ?Component Value Date  ? WBC 8.6 03/30/2021  ? HGB 11.6 (L) 03/30/2021  ? HCT 35.2 (L) 03/30/2021  ? MCV 90.0 03/30/2021  ? PLT 248 03/30/2021  ? ? ?Assessment / Plan: ? G1P0 at [redacted]w[redacted]d admitted for induction of labor due to gHTN and A2GDM (on Glyburide). ? ?Labor:  Foley balloon deflated in room given close to being out and constant cramping. Dilated to 3, will start pitocin to augment. ? ?Fetal Wellbeing:  Category I ?I/D:   GBS neg ? ? ?#gHTN ?BP 112-130s systolics most recently. Asymptomatic. Will continue to monitor. ? ?#A2GDM ?Outpatient on Glyburide. Here q4hr checks while in early labor have been within appropriate range. Will continue q4hr checks and then switch to q2hr once in active labor. Glucose has been 100-95 most recently ? ?Wanda Craig ?03/31/2021, 1:50 AM ? ? ?

## 2021-03-31 NOTE — Anesthesia Procedure Notes (Signed)
Epidural ?Patient location during procedure: OB ?Start time: 03/31/2021 8:30 PM ?End time: 03/31/2021 8:40 PM ? ?Staffing ?Anesthesiologist: Freddrick March, MD ?Performed: anesthesiologist  ? ?Preanesthetic Checklist ?Completed: patient identified, IV checked, risks and benefits discussed, monitors and equipment checked, pre-op evaluation and timeout performed ? ?Epidural ?Patient position: sitting ?Prep: DuraPrep and site prepped and draped ?Patient monitoring: continuous pulse ox, blood pressure, heart rate and cardiac monitor ?Approach: midline ?Location: L3-L4 ?Injection technique: LOR air ? ?Needle:  ?Needle type: Tuohy  ?Needle gauge: 17 G ?Needle length: 9 cm ?Needle insertion depth: 6 cm ?Catheter type: closed end flexible ?Catheter size: 19 Gauge ?Catheter at skin depth: 11 cm ?Test dose: negative ? ?Assessment ?Sensory level: T8 ?Events: blood not aspirated, injection not painful, no injection resistance, no paresthesia and negative IV test ? ?Additional Notes ?Patient identified. Risks/Benefits/Options discussed with patient including but not limited to bleeding, infection, nerve damage, paralysis, failed block, incomplete pain control, headache, blood pressure changes, nausea, vomiting, reactions to medication both or allergic, itching and postpartum back pain. Confirmed with bedside nurse the patient's most recent platelet count. Confirmed with patient that they are not currently taking any anticoagulation, have any bleeding history or any family history of bleeding disorders. Patient expressed understanding and wished to proceed. All questions were answered. Sterile technique was used throughout the entire procedure. Please see nursing notes for vital signs. Test dose was given through epidural catheter and negative prior to continuing to dose epidural or start infusion. Warning signs of high block given to the patient including shortness of breath, tingling/numbness in hands, complete motor block, or  any concerning symptoms with instructions to call for help. Patient was given instructions on fall risk and not to get out of bed. All questions and concerns addressed with instructions to call with any issues or inadequate analgesia.  Reason for block:procedure for pain ? ? ? ?

## 2021-03-31 NOTE — Anesthesia Preprocedure Evaluation (Signed)
Anesthesia Evaluation  ?Patient identified by MRN, date of birth, ID band ?Patient awake ? ? ? ?Reviewed: ?Allergy & Precautions, NPO status , Patient's Chart, lab work & pertinent test results ? ?Airway ?Mallampati: III ? ?TM Distance: >3 FB ?Neck ROM: Full ? ? ? Dental ?no notable dental hx. ? ?  ?Pulmonary ?neg pulmonary ROS,  ?  ?Pulmonary exam normal ?breath sounds clear to auscultation ? ? ? ? ? ? Cardiovascular ?hypertension (gTHN), Normal cardiovascular exam ?Rhythm:Regular Rate:Normal ? ? ?  ?Neuro/Psych ?negative neurological ROS ? negative psych ROS  ? GI/Hepatic ?negative GI ROS, Neg liver ROS,   ?Endo/Other  ?diabetes, GestationalMorbid obesity (BMI 42) ? Renal/GU ?negative Renal ROS  ?negative genitourinary ?  ?Musculoskeletal ?negative musculoskeletal ROS ?(+)  ? Abdominal ?  ?Peds ? Hematology ?negative hematology ROS ?(+)   ?Anesthesia Other Findings ?IOL for gHTN and gDM ? Reproductive/Obstetrics ?(+) Pregnancy ? ?  ? ? ? ? ? ? ? ? ? ? ? ? ? ?  ?  ? ? ? ? ? ? ? ? ?Anesthesia Physical ?Anesthesia Plan ? ?ASA: 3 ? ?Anesthesia Plan: Epidural  ? ?Post-op Pain Management:   ? ?Induction:  ? ?PONV Risk Score and Plan: Treatment may vary due to age or medical condition ? ?Airway Management Planned: Natural Airway ? ?Additional Equipment:  ? ?Intra-op Plan:  ? ?Post-operative Plan:  ? ?Informed Consent: I have reviewed the patients History and Physical, chart, labs and discussed the procedure including the risks, benefits and alternatives for the proposed anesthesia with the patient or authorized representative who has indicated his/her understanding and acceptance.  ? ? ? ? ? ?Plan Discussed with: Anesthesiologist ? ?Anesthesia Plan Comments: (Patient identified. Risks, benefits, options discussed with patient including but not limited to bleeding, infection, nerve damage, paralysis, failed block, incomplete pain control, headache, blood pressure changes, nausea,  vomiting, reactions to medication, itching, and post partum back pain. Confirmed with bedside nurse the patient's most recent platelet count. Confirmed with the patient that they are not taking any anticoagulation, have any bleeding history or any family history of bleeding disorders. Patient expressed understanding and wishes to proceed. All questions were answered. )  ? ? ? ? ? ? ?Anesthesia Quick Evaluation ? ?

## 2021-03-31 NOTE — Progress Notes (Signed)
Labor Progress Note ?Wanda Craig is a 26 y.o. G1P0 at [redacted]w[redacted]d who presented for IOL due to gHTN and A2GDM. ? ?S: Doing well. Feeling some contractions here and there. No concerns at this time.  ? ?O:  ?BP 140/89   Pulse 82   Temp 98.8 ?F (37.1 ?C) (Oral)   Resp 16   Ht 5\' 3"  (1.6 m)   Wt 106.4 kg   LMP  (LMP Unknown)   BMI 41.56 kg/m?  ? ?EFM: Baseline 130 bpm, moderate variability, + accels, no decels ?Toco: Every 2-3 minutes  ? ?CVE: Dilation: 3.5 ?Effacement (%): 50, 60 ?Cervical Position: Posterior ?Station: -3 ?Presentation: Vertex ?Exam by:: [email or web address] MD ? ?A&P: 26 y.o. G1P0 [redacted]w[redacted]d  ? ?#Labor: SVE unchanged from last exam. Verbally consented for AROM. AROM performed with clear fluid. Mom and baby tolerated this well. IUPC placed. Will continue to up titrate Pitocin and reassess in 4-5 hours.  ?#Pain: PRN ?#FWB: Cat 1  ?#GBS negative ? ?#gHTN: Normal to mild range BP. Pre-E labs normal on admission. No symptoms. Will continue to monitor.  ? ?#A2GDM: CBGs remain at goal. Will continue q4hr glucose checks in latent phase.  ? ?[redacted]w[redacted]d, MD ?12:51 PM ? ?

## 2021-03-31 NOTE — Progress Notes (Signed)
Wanda Craig is a 26 y.o. G1P0 at [redacted]w[redacted]d admitted for induction of labor due to gHTN and A2GDM. ? ?Subjective: ?Feeling much better now that has epidural.  Not feeling contractions. ? ?Objective: ?BP 104/61   Pulse (!) 105   Temp 98.3 ?F (36.8 ?C) (Oral)   Resp 16   Ht 5\' 3"  (1.6 m)   Wt 106.4 kg   LMP  (LMP Unknown)   BMI 41.56 kg/m?  ?No intake/output data recorded. ?No intake/output data recorded. ? ?FHT:  FHR: 130 bpm, variability: moderate,  accelerations:  Present,  decelerations:  Absent ?UC:   regular, every 3-4 minutes, MVUs 160-190 ?SVE:   Dilation: 4 ?Effacement (%): 70 ?Station: -2 ?Exam by:: [email or web address] MD ? ?Labs: ?Lab Results  ?Component Value Date  ? WBC 16.4 (H) 03/31/2021  ? HGB 12.8 03/31/2021  ? HCT 38.7 03/31/2021  ? MCV 90.2 03/31/2021  ? PLT 228 03/31/2021  ? ? ?Assessment / Plan: ?G1P0 at [redacted]w[redacted]d admitted for induction of labor due to gHTN and A2GDM. ? ?Labor:  Cervix changed from 3/3.5 to 4 at last check. Also advanced in effacement from 50 to 70%/ Pitocin at 30 now. Will continue at this time and recheck 4 hours from last check (~12 AM). If unchanged will give pitocin break to reset receptors. ? ?Fetal Wellbeing:  Category I ?Pain Control:  Epidural ?I/D:   GBS negative ? ? ?#A2GDM ?Q4hr checks within goal (90s- low 100s). Will continue q4hr checks. ? ?#gHTN ?BP now normotensive. Previously had some elevated prior to epidural but likely related to pain as patient was extremely uncomfortable prior to epidural.  ? ?[redacted]w[redacted]d, MD, MPH ?OB Fellow, [facility] ? ? ? ?

## 2021-04-01 ENCOUNTER — Encounter (HOSPITAL_COMMUNITY): Payer: Self-pay | Admitting: Obstetrics & Gynecology

## 2021-04-01 DIAGNOSIS — O24419 Gestational diabetes mellitus in pregnancy, unspecified control: Secondary | ICD-10-CM

## 2021-04-01 DIAGNOSIS — O9962 Diseases of the digestive system complicating childbirth: Secondary | ICD-10-CM

## 2021-04-01 DIAGNOSIS — O134 Gestational [pregnancy-induced] hypertension without significant proteinuria, complicating childbirth: Secondary | ICD-10-CM

## 2021-04-01 DIAGNOSIS — O24425 Gestational diabetes mellitus in childbirth, controlled by oral hypoglycemic drugs: Secondary | ICD-10-CM

## 2021-04-01 DIAGNOSIS — O139 Gestational [pregnancy-induced] hypertension without significant proteinuria, unspecified trimester: Secondary | ICD-10-CM

## 2021-04-01 DIAGNOSIS — Z3A38 38 weeks gestation of pregnancy: Secondary | ICD-10-CM

## 2021-04-01 LAB — CBC
HCT: 31.3 % — ABNORMAL LOW (ref 36.0–46.0)
Hemoglobin: 10.7 g/dL — ABNORMAL LOW (ref 12.0–15.0)
MCH: 30.3 pg (ref 26.0–34.0)
MCHC: 34.2 g/dL (ref 30.0–36.0)
MCV: 88.7 fL (ref 80.0–100.0)
Platelets: 187 10*3/uL (ref 150–400)
RBC: 3.53 MIL/uL — ABNORMAL LOW (ref 3.87–5.11)
RDW: 13.2 % (ref 11.5–15.5)
WBC: 25.7 10*3/uL — ABNORMAL HIGH (ref 4.0–10.5)
nRBC: 0 % (ref 0.0–0.2)

## 2021-04-01 LAB — GLUCOSE, CAPILLARY
Glucose-Capillary: 116 mg/dL — ABNORMAL HIGH (ref 70–99)
Glucose-Capillary: 104 mg/dL — ABNORMAL HIGH (ref 70–99)
Glucose-Capillary: 97 mg/dL (ref 70–99)
Glucose-Capillary: 119 mg/dL — ABNORMAL HIGH (ref 70–99)

## 2021-04-01 MED ORDER — PRENATAL MULTIVITAMIN CH
1.0000 | ORAL_TABLET | Freq: Every day | ORAL | Status: DC
  Administered 2021-04-03: 1 via ORAL
  Filled 2021-04-01 (×2): qty 1

## 2021-04-01 MED ORDER — ZOLPIDEM TARTRATE 5 MG PO TABS
5.0000 mg | ORAL_TABLET | Freq: Every evening | ORAL | Status: DC | PRN
Start: 2021-04-01 — End: 2021-04-04

## 2021-04-01 MED ORDER — SIMETHICONE 80 MG PO CHEW: 80.0000 mg | CHEWABLE_TABLET | ORAL | Status: DC | PRN

## 2021-04-01 MED ORDER — DIBUCAINE (PERIANAL) 1 % EX OINT
1.0000 | TOPICAL_OINTMENT | CUTANEOUS | Status: DC | PRN
Start: 2021-04-01 — End: 2021-04-04

## 2021-04-01 MED ORDER — ONDANSETRON HCL 4 MG PO TABS: 4.0000 mg | ORAL_TABLET | ORAL | Status: DC | PRN

## 2021-04-01 MED ORDER — TETANUS-DIPHTH-ACELL PERTUSSIS 5-2.5-18.5 LF-MCG/0.5 IM SUSY
0.5000 mL | PREFILLED_SYRINGE | Freq: Once | INTRAMUSCULAR | Status: DC
Start: 2021-04-02 — End: 2021-04-04

## 2021-04-01 MED ORDER — RHO D IMMUNE GLOBULIN 1500 UNIT/2ML IJ SOSY
300.0000 ug | PREFILLED_SYRINGE | Freq: Once | INTRAMUSCULAR | Status: AC
  Administered 2021-04-02: 300 ug via INTRAVENOUS
  Filled 2021-04-01: qty 2

## 2021-04-01 MED ORDER — OXYCODONE HCL 5 MG PO TABS: 5.0000 mg | ORAL_TABLET | ORAL | Status: DC | PRN

## 2021-04-01 MED ORDER — SENNOSIDES-DOCUSATE SODIUM 8.6-50 MG PO TABS
2.0000 | ORAL_TABLET | ORAL | Status: DC
  Administered 2021-04-02 – 2021-04-03 (×2): 2 via ORAL
  Filled 2021-04-01: qty 2

## 2021-04-01 MED ORDER — IBUPROFEN 600 MG PO TABS
600.0000 mg | ORAL_TABLET | Freq: Four times a day (QID) | ORAL | Status: DC
  Administered 2021-04-01 – 2021-04-03 (×8): 600 mg via ORAL
  Filled 2021-04-01 (×10): qty 1

## 2021-04-01 MED ORDER — BENZOCAINE-MENTHOL 20-0.5 % EX AERO: 1.0000 "application " | INHALATION_SPRAY | CUTANEOUS | Status: DC | PRN

## 2021-04-01 MED ORDER — ACETAMINOPHEN 325 MG PO TABS
650.0000 mg | ORAL_TABLET | ORAL | Status: DC | PRN
Start: 2021-04-01 — End: 2021-04-04

## 2021-04-01 MED ORDER — TRANEXAMIC ACID-NACL 1000-0.7 MG/100ML-% IV SOLN
1000.0000 mg | Freq: Once | INTRAVENOUS | Status: AC
  Administered 2021-04-01: 1000 mg via INTRAVENOUS
  Filled 2021-04-01: qty 100

## 2021-04-01 MED ORDER — OXYCODONE HCL 5 MG PO TABS: 10.0000 mg | ORAL_TABLET | ORAL | Status: DC | PRN

## 2021-04-01 MED ORDER — COCONUT OIL OIL: 1.0000 "application " | TOPICAL_OIL | Status: DC | PRN

## 2021-04-01 MED ORDER — ONDANSETRON HCL 4 MG/2ML IJ SOLN: 4.0000 mg | INTRAMUSCULAR | Status: DC | PRN

## 2021-04-01 MED ORDER — WITCH HAZEL-GLYCERIN EX PADS
1.0000 | MEDICATED_PAD | CUTANEOUS | Status: DC | PRN
Start: 2021-04-01 — End: 2021-04-04

## 2021-04-01 MED ORDER — DIPHENHYDRAMINE HCL 25 MG PO CAPS: 25.0000 mg | ORAL_CAPSULE | Freq: Four times a day (QID) | ORAL | Status: DC | PRN

## 2021-04-01 NOTE — Progress Notes (Signed)
Chaplain responded to Neo-natal Code Blue.  Baby and mother fine upon arrival on L&D unit so Chaplain did not initiate visit with pt. ? ?Vernell Morgans ?Chaplain ?

## 2021-04-01 NOTE — Discharge Summary (Addendum)
?  Postpartum Discharge Summary ? ? ?Patient Name: Wanda Craig ?DOB: 02/06/1995 ?MRN: [id number] ? ?Date of admission: 03/30/2021 ?Delivery date:04/01/2021  ?Delivering provider: WHITE, LAKASHA  ?Date of discharge: 04/03/2021 ? ?Admitting diagnosis: Supervision of high-risk pregnancy [O09.90] ?Intrauterine pregnancy: [redacted]w[redacted]d    ?Secondary diagnosis:  Principal Problem: ?  Supervision of high-risk pregnancy ?Active Problems: ?  Rh negative state in antepartum period ?  Cholelithiases ?  Gestational diabetes mellitus, class A2 ?  Gestational hypertension ? ?Additional problems: none    ?Discharge diagnosis: Term Pregnancy Delivered, CHTN, and GDM A2                                              ?Post partum procedures:none ?Augmentation: AROM, Pitocin, Cytotec, and IP Foley ?Complications: RAYT>01hours ? ?Hospital course: Induction of Labor With Vaginal Delivery   ?26y.o. yo G1P0 at 354w0das admitted to the hospital 03/30/2021 for induction of labor.  Indication for induction: TYPE 2 DM and CHTN .  Patient had an uncomplicated labor course as follows: ?Membrane Rupture Time/Date: 12:41 PM ,03/31/2021   ?Delivery Method:Vaginal, Spontaneous NSVD ?Episiotomy: None None  ?Lacerations:  None None ?Details of delivery can be found in separate delivery note.  Patient had a routine postpartum course. Patient is discharged home 04/03/21. ? ?Newborn Data: ?Birth date:04/01/2021  ?Birth time:4:49 PM  ?Gender:Female  ?Living status:Living  ?Apgars:2 ,6  ?Weight:6 lb 14.4 oz (3.13 kg)  ? ?Magnesium Sulfate received: No ?BMZ received: No ?Rhophylac:Yes ?MMR:N/A ?T-DaP: declined ?Flu: declined ?Transfusion:No ? ?Physical exam  ?Vitals:  ? 04/02/21 [id number]/06/23 1611 04/02/21 2044 04/03/21 0523  ?BP: 115/75 119/71 117/70 130/72  ?Pulse: 70 75 85 73  ?Resp: _0 ?Temp: 98 ?F (36.7 ?C) 97.7 ?F (36.5 ?C) 98 ?F (36.7 ?C) 97.9 ?F (36.6 ?C)  ?TempSrc: Oral Oral Oral Oral  ?SpO2:   97% 98%  ?Weight:      ?Height:      ? ?General: alert,  cooperative, and no distress ?Lochia: appropriate ?Uterine Fundus: firm ?Incision: N/A ?DVT Evaluation: No evidence of DVT seen on physical exam. ?No significant calf/ankle edema. ?Labs: ?Lab Results  ?Component Value Date  ? WBC 22.0 (H) 04/02/2021  ? HGB 9.5 (L) 04/02/2021  ? HCT 28.5 (L) 04/02/2021  ? MCV 90.5 04/02/2021  ? PLT 201 04/02/2021  ? ?CMP Latest Ref Rng & Units 04/03/2021  ?Glucose 70 - 99 mg/dL 84  ?BUN 6 - 20 mg/dL -  ?Creatinine 0.44 - 1.00 mg/dL -  ?Sodium 135 - 145 mmol/L -  ?Potassium 3.5 - 5.1 mmol/L -  ?Chloride 98 - 111 mmol/L -  ?CO2 22 - 32 mmol/L -  ?Calcium 8.9 - 10.3 mg/dL -  ?Total Protein 6.5 - 8.1 g/dL -  ?Total Bilirubin 0.3 - 1.2 mg/dL -  ?Alkaline Phos 38 - 126 U/L -  ?AST 15 - 41 U/L -  ?ALT 0 - 44 U/L -  ? ?Edinburgh Score: ?Edinburgh Postnatal Depression Scale Screening Tool 04/01/2021  ?I have been able to laugh and see the funny side of things. 0  ?I have looked forward with enjoyment to things. 0  ?I have blamed myself unnecessarily when things went wrong. 1  ?I have been anxious or worried for no good reason. 1  ?I have felt scared or panicky for no good reason. 0  ?  Things have been getting on top of me. 1  ?I have been so unhappy that I have had difficulty sleeping. 0  ?I have felt sad or miserable. 0  ?I have been so unhappy that I have been crying. 0  ?The thought of harming myself has occurred to me. 0  ?Edinburgh Postnatal Depression Scale Total 3  ? ? ? ? ?After visit meds:  ?Allergies as of 04/03/2021   ?No Known Allergies ?  ? ?  ?Medication List  ?  ? ?STOP taking these medications   ? ?Accu-Chek Guide Me w/Device Kit ?  ?Accu-Chek Guide test strip ?Generic drug: glucose blood ?  ?Accu-Chek Softclix Lancets lancets ?  ?aspirin 81 MG chewable tablet ?  ?Blood Pressure Monitor Misc ?  ?cetirizine 10 MG tablet ?Commonly known as: ZYRTEC ?  ?fluticasone 50 MCG/ACT nasal spray ?Commonly known as: FLONASE ?  ?glyBURIDE 2.5 MG tablet ?Commonly known as: DIABETA ?  ?ondansetron 4  MG disintegrating tablet ?Commonly known as: ZOFRAN-ODT ?  ?pantoprazole 20 MG tablet ?Commonly known as: PROTONIX ?  ?promethazine 25 MG tablet ?Commonly known as: PHENERGAN ?  ? ?  ? ?TAKE these medications   ? ?acetaminophen 325 MG tablet ?Commonly known as: Tylenol ?Take 2 tablets (650 mg total) by mouth every 4 (four) hours as needed (for pain scale < 4). ?What changed:  ?medication strength ?how much to take ?when to take this ?reasons to take this ?  ?ferrous sulfate 325 (65 FE) MG tablet ?Take 1 tablet (325 mg total) by mouth every other day. ?  ?furosemide 20 MG tablet ?Commonly known as: Lasix ?Take 1 tablet (20 mg total) by mouth daily. ?  ?ibuprofen 600 MG tablet ?Commonly known as: ADVIL ?Take 1 tablet (600 mg total) by mouth every 6 (six) hours. ?  ?multivitamin-prenatal 27-0.8 MG Tabs tablet ?Take 1 tablet by mouth daily at 12 noon. ?  ?norethindrone 0.35 MG tablet ?Commonly known as: Ortho Micronor ?Take 1 tablet (0.35 mg total) by mouth daily. ?  ? ?  ? ? ? ?Discharge home in stable condition ?Infant Feeding: Breast ?Infant Disposition:home with mother ?Discharge instruction: per After Visit Summary and Postpartum booklet. ?Activity: Advance as tolerated. Pelvic rest for 6 weeks.  ?Diet: carb modified diet ?Anticipated Birth Control: Unsure ?Postpartum Appointment:6 weeks ?Additional Postpartum F/U: BP check 1 week ?Future Appointments: ?Future Appointments  ?Date Time Provider Graham  ?04/09/2021 10:50 AM [facility] NURSE [facility]  ?05/09/2021 11:10 AM Roma Schanz, CNM [facility]  ? ?Follow up Visit: ? ? ?  ? ?04/03/2021 ?Christin Fudge, CNM ? ? ?

## 2021-04-01 NOTE — Progress Notes (Signed)
Wanda Craig is a 26 y.o. G1P0 at [redacted]w[redacted]d  admitted for IOL 2/2 gHTN and A2GDM ? ?Subjective: ? ?Patient is asking about a c-section or how much longer she will be in labor. She had a pitocin break overnight. Pitocin has been back on since 0200 today and is now at 16mu/min. She does not have adequate MVUs.  ? ?Objective: ?BP (!) 115/53   Pulse (!) 114   Temp 98.9 ?F (37.2 ?C) (Oral)   Resp 16   Ht 5\' 3"  (1.6 m)   Wt 106.4 kg   LMP  (LMP Unknown)   BMI 41.56 kg/m?  ?I/O last 3 completed shifts: ?In: 1718.4 [I.V.:1600; Other:118.4] ?Out: 1250 [Urine:1250] ?No intake/output data recorded. ? ?FHT:  FHR: 145 bpm, variability: moderate,  accelerations:  Abscent,  decelerations:  Absent ?UC:   regular, every 2-3 minutes ?SVE:   Dilation: Lip/rim ?Effacement (%): 70, 80 ?Station: Plus 1, 0 ?Exam by:: Norman Herrlich CNM ? ?Labs: ?Lab Results  ?Component Value Date  ? WBC 16.4 (H) 03/31/2021  ? HGB 12.8 03/31/2021  ? HCT 38.7 03/31/2021  ? MCV 90.2 03/31/2021  ? PLT 228 03/31/2021  ? ? ?Assessment / Plan: ?Induction of labor due to gHTN and A2GDM,  progressing well on pitocin ? ?Labor: Progressing normally ?Preeclampsia:   NA ?Fetal Wellbeing:  Category I ?Pain Control:  Epidural ?I/D:  n/a ?Anticipated MOD:  NSVD ? ?Marcille Buffy DNP, CNM  ?04/01/21  9:58 AM  ? ? ? ?

## 2021-04-01 NOTE — Progress Notes (Signed)
Wanda Craig is a 26 y.o. G1P0 at [redacted]w[redacted]d  admitted for IOL 2/2 gHTN and A2GDM ? ?Subjective: ? ?Patient has slept since last check. Still not feeling any pressure at this time.  ? ? ?Objective: ?BP 127/66   Pulse (!) 106   Temp 98.8 ?F (37.1 ?C) (Axillary)   Resp 17   Ht 5\' 3"  (1.6 m)   Wt 106.4 kg   LMP  (LMP Unknown)   BMI 41.56 kg/m?  ?I/O last 3 completed shifts: ?In: 1718.4 [I.V.:1600; Other:118.4] ?Out: 1250 [Urine:1250] ?No intake/output data recorded. ? ?FHT:  FHR: 150 bpm, variability: moderate,  accelerations:  Present,  decelerations:  Present occasional variable and early ?UC:   regular, every 2-3 minutes ?SVE:   Dilation: 10 ?Effacement (%): 70, 80 ?Station: Plus 1 ?Exam by:: Norman Herrlich CNM ? ?Labs: ?Lab Results  ?Component Value Date  ? WBC 16.4 (H) 03/31/2021  ? HGB 12.8 03/31/2021  ? HCT 38.7 03/31/2021  ? MCV 90.2 03/31/2021  ? PLT 228 03/31/2021  ? ? ?Assessment / Plan: ?Patient now fully dilated.  ?Attempted some practice pushes, but patient is not feeling much of anything due to dense epidural. Will use the peanut ball and position changes to allow for passive descent. Patient in agreement with this plan.  ? ?Labor: Progressing normally ?Preeclampsia:   NA ?Fetal Wellbeing:  Category I ?Pain Control:  Epidural ?I/D:  n/a ?Anticipated MOD:  NSVD ? ?Marcille Buffy DNP, CNM  ?04/01/21  12:10 PM  ? ? ? ?

## 2021-04-02 ENCOUNTER — Encounter: Payer: Medicaid Other | Admitting: Women's Health

## 2021-04-02 ENCOUNTER — Other Ambulatory Visit: Payer: Medicaid Other

## 2021-04-02 LAB — CBC
HCT: 28.5 % — ABNORMAL LOW (ref 36.0–46.0)
Hemoglobin: 9.5 g/dL — ABNORMAL LOW (ref 12.0–15.0)
MCH: 30.2 pg (ref 26.0–34.0)
MCHC: 33.3 g/dL (ref 30.0–36.0)
MCV: 90.5 fL (ref 80.0–100.0)
Platelets: 201 10*3/uL (ref 150–400)
RBC: 3.15 MIL/uL — ABNORMAL LOW (ref 3.87–5.11)
RDW: 13.5 % (ref 11.5–15.5)
WBC: 22 10*3/uL — ABNORMAL HIGH (ref 4.0–10.5)
nRBC: 0 % (ref 0.0–0.2)

## 2021-04-02 MED ORDER — FERROUS SULFATE 325 (65 FE) MG PO TABS
325.0000 mg | ORAL_TABLET | ORAL | Status: DC
  Administered 2021-04-03: 325 mg via ORAL
  Filled 2021-04-02: qty 1

## 2021-04-02 NOTE — Progress Notes (Signed)
POSTPARTUM PROGRESS NOTE ? ?Subjective: Wanda Craig is a 26 y.o. G1P1001 s/p NSVD at [redacted]w[redacted]d.  She reports she doing well. No acute events overnight. She denies any problems with ambulating, voiding or po intake. Denies nausea or vomiting. No headaches, vision changes, RUQ pain, chest pain, shortness of breath. She has passed flatus. Pain is well controlled.  Lochia is decreasing. ? ?Objective: ?Blood pressure 115/75, pulse 70, temperature 98 ?F (36.7 ?C), temperature source Oral, resp. rate 18, height 5\' 3"  (1.6 m), weight 106.4 kg, SpO2 100 %, unknown if currently breastfeeding. ? ?Physical Exam:  ?General: alert, cooperative and no distress ?Chest: no respiratory distress ?Abdomen: soft, non-tender  ?Uterine Fundus: firm and at level of umbilicus ?Extremities: No calf swelling or tenderness  no edema ? ?Recent Labs  ?  04/01/21 ?1908 04/02/21 ?0411  ?HGB 10.7* 9.5*  ?HCT 31.3* 28.5*  ? ? ?Assessment/Plan: ?Wanda Craig is a 27 y.o. G1P1001 s/p NSVD at [redacted]w[redacted]d. ?Routine Postpartum Care: Doing well, pain well-controlled.  ?-- Continue routine care, lactation support  ?-- Contraception: POPs at discharge ?-- Feeding: bottle ? ?RH Negative ?- infant O pos, Rhogam per protocol today ? ?Gestational HTN ?- Normotensive, asymptomatic, labs negative for pre-eclampsia on admission, continue to monitor ? ?A2GDM ?- FPG this AM 90s per patient report, will confirm with RN, glyburide discontinued ? ?ABLA ?- PO ferrous sulfate every other day starting tomorrow ? ?Dispo: Plan for discharge tomorrow. ? ?[redacted]w[redacted]d, MD ?[facility] ?04/02/2021 9:52 AM  ?

## 2021-04-02 NOTE — Anesthesia Postprocedure Evaluation (Signed)
Anesthesia Post Note ? ?Patient: Wanda Craig ? ?Procedure(s) Performed: AN AD HOC LABOR EPIDURAL ? ?  ? ?Patient location during evaluation: Mother Baby ?Anesthesia Type: Epidural ?Level of consciousness: awake, oriented and awake and alert ?Pain management: pain level controlled ?Vital Signs Assessment: post-procedure vital signs reviewed and stable ?Respiratory status: spontaneous breathing, respiratory function stable and nonlabored ventilation ?Cardiovascular status: stable ?Postop Assessment: adequate PO intake, no headache, able to ambulate, patient able to bend at knees, no backache and no apparent nausea or vomiting ?Anesthetic complications: no ? ? ?No notable events documented. ? ?Last Vitals:  ?Vitals:  ? 04/02/21 0545 04/02/21 9833  ?BP: 108/70 115/75  ?Pulse: 81 70  ?Resp: 16 18  ?Temp: 36.6 ?C 36.7 ?C  ?SpO2: 100%   ?  ?Last Pain:  ?Vitals:  ? 04/02/21 0822  ?TempSrc: Oral  ?PainSc: 1   ? ?Pain Goal: Patients Stated Pain Goal: 0 (03/30/21 2320) ? ?  ?  ?  ?  ?  ?  ?Epidural/Spinal Function Cutaneous sensation: Normal sensation (04/02/21 8250) ? ?Salome Hautala ? ? ? ? ?

## 2021-04-02 NOTE — Plan of Care (Signed)
Pt stable throughout shift. Transferred from L and D to MD Unit. VS stable, bleeding WDL, uterus firm, U/E, midline. Pt mobile and steady on her feet, pain well-controlled with scheduled medications. Continue to monitor and support as needed.  ?

## 2021-04-03 ENCOUNTER — Other Ambulatory Visit (HOSPITAL_COMMUNITY): Payer: Self-pay

## 2021-04-03 LAB — RH IG WORKUP (INCLUDES ABO/RH)
Fetal Screen: NEGATIVE
Gestational Age(Wks): 38
Unit division: 0

## 2021-04-03 LAB — GLUCOSE, RANDOM: Glucose, Bld: 84 mg/dL (ref 70–99)

## 2021-04-03 MED ORDER — FUROSEMIDE 20 MG PO TABS
20.0000 mg | ORAL_TABLET | Freq: Every day | ORAL | 0 refills | Status: DC
  Filled 2021-04-03: qty 5, 5d supply, fill #0

## 2021-04-03 MED ORDER — IBUPROFEN 600 MG PO TABS
600.0000 mg | ORAL_TABLET | Freq: Four times a day (QID) | ORAL | 0 refills | Status: DC
  Filled 2021-04-03: qty 30, 8d supply, fill #0

## 2021-04-03 MED ORDER — ACETAMINOPHEN 325 MG PO TABS: 650.0000 mg | ORAL_TABLET | ORAL | Status: DC | PRN

## 2021-04-03 MED ORDER — NORETHINDRONE 0.35 MG PO TABS
1.0000 | ORAL_TABLET | Freq: Every day | ORAL | 0 refills | Status: DC
  Filled 2021-04-03: qty 28, 28d supply, fill #0

## 2021-04-03 MED ORDER — FERROUS SULFATE 325 (65 FE) MG PO TABS
325.0000 mg | ORAL_TABLET | ORAL | 0 refills | Status: DC
  Filled 2021-04-03: qty 15, 30d supply, fill #0

## 2021-04-03 NOTE — Progress Notes (Signed)
Postpartum BabyScripts has been initiated and confirmed in patients phone app.  ?

## 2021-04-04 ENCOUNTER — Encounter: Payer: Self-pay | Admitting: *Deleted

## 2021-04-05 ENCOUNTER — Encounter: Payer: Self-pay | Admitting: Obstetrics and Gynecology

## 2021-04-05 ENCOUNTER — Other Ambulatory Visit: Payer: Medicaid Other | Admitting: Obstetrics & Gynecology

## 2021-04-09 ENCOUNTER — Other Ambulatory Visit: Payer: Self-pay | Admitting: Obstetrics & Gynecology

## 2021-04-09 ENCOUNTER — Ambulatory Visit (INDEPENDENT_AMBULATORY_CARE_PROVIDER_SITE_OTHER): Payer: Medicaid Other

## 2021-04-09 ENCOUNTER — Other Ambulatory Visit: Payer: Self-pay

## 2021-04-09 VITALS — BP 130/78 | HR 72 | Ht 63.0 in | Wt 214.0 lb

## 2021-04-09 DIAGNOSIS — Z0130 Encounter for examination of blood pressure without abnormal findings: Secondary | ICD-10-CM

## 2021-04-09 NOTE — Progress Notes (Signed)
? ?  NURSE VISIT- BLOOD PRESSURE CHECK ? ?SUBJECTIVE:  ?Wanda Craig is a 26 y.o. G57P1001 female here for BP check. She is postpartum, delivery date 04/01/21    ? ?HYPERTENSION ROS: ? ?Postpartum:  ?Severe headaches that don't go away with tylenol/other medicines: No  ?Visual changes (seeing spots/double/blurred vision) No  ?Severe pain under right breast breast or in center of upper chest No  ?Severe nausea/vomiting No  ?Taking medicines as instructed not applicable ? ?OBJECTIVE:  ?BP 130/78 (BP Location: Right Arm, Patient Position: Sitting, Cuff Size: Large)   Pulse 72   LMP  (LMP Unknown)   Breastfeeding Yes   ?Appearance alert, well appearing, and in no distress, oriented to person, place, and time, and normal appearing weight. ? ?ASSESSMENT: ?Postpartum  blood pressure check ? ?PLAN: ?Discussed with Joellyn Haff, CNM, WHNP   ?Recommendations: no changes needed   ?Follow-up: as scheduled  ? ?Elysha Daw A Michaelyn Wall  ?04/09/2021 ?11:54 AM  ?

## 2021-04-09 NOTE — Progress Notes (Signed)
See note already filed for 03/05/21 encounter

## 2021-04-10 ENCOUNTER — Encounter: Payer: Self-pay | Admitting: Family Medicine

## 2021-04-12 ENCOUNTER — Other Ambulatory Visit: Payer: Medicaid Other | Admitting: Obstetrics & Gynecology

## 2021-05-09 ENCOUNTER — Ambulatory Visit (INDEPENDENT_AMBULATORY_CARE_PROVIDER_SITE_OTHER): Payer: Medicaid Other | Admitting: Women's Health

## 2021-05-09 ENCOUNTER — Encounter: Payer: Self-pay | Admitting: Women's Health

## 2021-05-09 DIAGNOSIS — Z30018 Encounter for initial prescription of other contraceptives: Secondary | ICD-10-CM | POA: Diagnosis not present

## 2021-05-09 DIAGNOSIS — Z8632 Personal history of gestational diabetes: Secondary | ICD-10-CM | POA: Diagnosis not present

## 2021-05-09 DIAGNOSIS — Z3202 Encounter for pregnancy test, result negative: Secondary | ICD-10-CM | POA: Diagnosis not present

## 2021-05-09 LAB — POCT URINE PREGNANCY: Preg Test, Ur: NEGATIVE

## 2021-05-09 MED ORDER — PHEXXI 1.8-1-0.4 % VA GEL: 1.0000 | Freq: Once | VAGINAL | 11 refills | Status: AC

## 2021-05-09 NOTE — Addendum Note (Signed)
Addended by: Shawna Clamp R on: 05/09/2021 12:01 PM ? ? Modules accepted: Orders ? ?

## 2021-05-09 NOTE — Patient Instructions (Signed)
You will have your sugar test next visit.  Please do not eat or drink anything after midnight the night before you come, not even water.  You will be here for at least two hours.  Please make an appointment online for the bloodwork at [email or web address] for 8:30am (or as close to this as possible). Make sure you select the [address] service center. The day of the appointment, check in with our office first, then you will go to [facility] to start the sugar test.   

## 2021-05-09 NOTE — Progress Notes (Signed)
? ?POSTPARTUM VISIT ?Patient name: Wanda Craig MRN [id number]  Date of birth: 10-Jun-1995 ?Chief Complaint:   ?Postpartum Care (Has a prescription for Micronor) ? ?History of Present Illness:   ?Wanda Craig is a 26 y.o. G28P1001 Caucasian female being seen today for a postpartum visit. She is 5 weeks postpartum following a spontaneous vaginal delivery at 38.0 gestational weeks. IOL: yes, for diabetes mellitus A2DM  and gestational hypertension . Anesthesia: epidural.  Laceration: none.  Complications: none. Inpatient contraception: rx'd micronor, hasn't started ?Pregnancy complicated by [facility] and Y8MV . ?Tobacco use: no. Substance use disorder: no. ?Last pap smear: 11/01/20 and results were NILM w/ HRHPV negative. Next pap smear due: 2025 ?No LMP recorded (lmp unknown). ? ?Postpartum course has been uncomplicated. Bleeding none. Bowel function is normal. Bladder function is normal. Urinary incontinence? no, fecal incontinence? no ?Patient is sexually active. Last sexual activity:  last week . Desired contraception:  discussed options- wants Phexxi . Patient does want a pregnancy in the future.  Desired family size is maybe 2-3 children.  ? ?The pregnancy intention screening data noted above was reviewed. Potential methods of contraception were discussed. The patient elected to proceed with No data recorded. ? ?Edinburgh Postpartum Depression Screening: negative ?  ? ?  10/04/2020  ?  9:43 AM  ?GAD 7 : Generalized Anxiety Score  ?Nervous, Anxious, on Edge 0  ?Control/stop worrying 0  ?Worry too much - different things 0  ?Trouble relaxing 0  ?Restless 0  ?Easily annoyed or irritable 0  ?Afraid - awful might happen 0  ?Total GAD 7 Score 0  ? ? ? ?Baby's course has been uncomplicated. Baby is feeding by bottle. Infant has a pediatrician/family doctor? Yes.  Childcare strategy if returning to work/school:  takes him to work w/ her .  Pt has material needs met for her and baby: Yes.   ?Review of Systems:   ?Pertinent items  are noted in HPI ?Denies Abnormal vaginal discharge w/ itching/odor/irritation, headaches, visual changes, shortness of breath, chest pain, abdominal pain, severe nausea/vomiting, or problems with urination or bowel movements. ?Pertinent History Reviewed:  ?Reviewed past medical,surgical, obstetrical and family history.  ?Reviewed problem list, medications and allergies. ?OB History  ?Gravida Para Term Preterm AB Living  ?'1 1 1     1  ' ?SAB IAB Ectopic Multiple Live Births  ?      0 1  ?  ?# Outcome Date GA Lbr Len/2nd Weight Sex Delivery Anes PTL Lv  ?1 Term 04/01/21 69w0d02:15 / 05:04 6 lb 14.4 oz (3.13 kg) M Vag-Spont EPI  LIV  ? ?Physical Assessment:  ? ?Vitals:  ? 05/09/21 1117  ?BP: 122/81  ?Pulse: 81  ?Weight: 210 lb 8 oz (95.5 kg)  ?Height: '5\' 3"'  (1.6 m)  ?Body mass index is 37.29 kg/m?. ? ?     Physical Examination:  ? General appearance: alert, well appearing, and in no distress ? Mental status: alert, oriented to person, place, and time ? Skin: warm & dry  ? Cardiovascular: normal heart rate noted  ? Respiratory: normal respiratory effort, no distress  ? Breasts: deferred, no complaints  ? Abdomen: soft, non-tender  ? Pelvic: examination not indicated. Thin prep pap obtained: No ? Rectal: not examined ? Extremities: Edema: none  ? ?Chaperone: N/A   ?      ?Results for orders placed or performed in visit on 05/09/21 (from the past 24 hour(s))  ?POCT urine pregnancy  ? Collection Time: 05/09/21 11:34  AM  ?Result Value Ref Range  ? Preg Test, Ur Negative Negative  ?  ?Assessment & Plan:  ?1) Postpartum exam ?2) 5 wks s/p spontaneous vaginal delivery after IOL for GHTN and A2DM ?3) bottle feeding ?4) Depression screening ?5) Contraception management: rx Phexxi to [facility], 1 sample box given ?6) GDM during pregnancy> 2hr GTT in 3wks ? ?Essential components of care per ACOG recommendations: ? ?1.  Mood and well being:  ?If positive depression screen, discussed and plan developed.  ?If using tobacco we  discussed reduction/cessation and risk of relapse ?If current substance abuse, we discussed and referral to local resources was offered.  ? ?2. Infant care and feeding:  ?If breastfeeding, discussed returning to work, pumping, breastfeeding-associated pain, guidance regarding return to fertility while lactating if not using another method. If needed, patient was provided with a letter to be allowed to pump q 2-3hrs to support lactation in a private location with access to a refrigerator to store breastmilk.   ?Recommended that all caregivers be immunized for flu, pertussis and other preventable communicable diseases ?If pt does not have material needs met for her/baby, referred to local resources for help obtaining these. ? ?3. Sexuality, contraception and birth spacing ?Provided guidance regarding sexuality, management of dyspareunia, and resumption of intercourse ?Discussed avoiding interpregnancy interval <73mhs and recommended birth spacing of 18 months ? ?4. Sleep and fatigue ?Discussed coping options for fatigue and sleep disruption ?Encouraged family/partner/community support of 4 hrs of uninterrupted sleep to help with mood and fatigue ? ?5. Physical recovery  ?If pt had a C/S, assessed incisional pain and providing guidance on normal vs prolonged recovery ?If pt had a laceration, perineal healing and pain reviewed.  ?If urinary or fecal incontinence, discussed management and referred to PT or uro/gyn if indicated  ?Patient is safe to resume physical activity. Discussed attainment of healthy weight. ? ?6.  Chronic disease management ?Discussed pregnancy complications if any, and their implications for future childbearing and long-term maternal health. ?Review recommendations for prevention of recurrent pregnancy complications, such as 17 hydroxyprogesterone caproate to reduce risk for recurrent PTB not applicable, or aspirin to reduce risk of preeclampsia yes. ?Pt had GDM: yes. If yes, 2hr GTT scheduled:  yes. ?Reviewed medications and non-pregnant dosing including consideration of whether pt is breastfeeding using a reliable resource such as LactMed: not applicable ?Referred for f/u w/ PCP or subspecialist providers as indicated: not applicable ? ?7. Health maintenance ?Mammogram at [place] earlier if indicated ?Pap smears as indicated ? ?Meds: No orders of the defined types were placed in this encounter. ? ? ?Follow-up: No follow-ups on file.  ? ?Orders Placed This Encounter  ?Procedures  ? POCT urine pregnancy  ? ? ?[place] WHNP-BC ?05/09/2021 ?11:34 AM ?  ?

## 2021-05-30 ENCOUNTER — Other Ambulatory Visit: Payer: Medicaid Other

## 2021-05-30 DIAGNOSIS — Z8632 Personal history of gestational diabetes: Secondary | ICD-10-CM

## 2021-12-10 ENCOUNTER — Ambulatory Visit (INDEPENDENT_AMBULATORY_CARE_PROVIDER_SITE_OTHER): Payer: Medicaid Other | Admitting: *Deleted

## 2021-12-10 VITALS — BP 121/74 | HR 72 | Ht 63.0 in | Wt 206.0 lb

## 2021-12-10 DIAGNOSIS — Z3201 Encounter for pregnancy test, result positive: Secondary | ICD-10-CM

## 2021-12-10 DIAGNOSIS — N926 Irregular menstruation, unspecified: Secondary | ICD-10-CM | POA: Diagnosis not present

## 2021-12-10 LAB — POCT URINE PREGNANCY: Preg Test, Ur: POSITIVE — AB

## 2021-12-10 NOTE — Progress Notes (Signed)
   NURSE VISIT- PREGNANCY CONFIRMATION   SUBJECTIVE:  Wanda Craig is a 26 y.o. G22P1001 female at [redacted]w[redacted]d by certain LMP of Patient's last menstrual period was 11/06/2021. Here for pregnancy confirmation.  Home pregnancy test: positive x 2   She reports no complaints.  She is not taking prenatal vitamins.    OBJECTIVE:  BP 121/74 (BP Location: Right Arm, Patient Position: Sitting, Cuff Size: Normal)   Pulse 72   Ht 5\' 3"  (1.6 m)   Wt 206 lb (93.4 kg)   LMP 11/06/2021   Breastfeeding No   BMI 36.49 kg/m   Appears well, in no apparent distress  Results for orders placed or performed in visit on 12/10/21 (from the past 24 hour(s))  POCT urine pregnancy   Collection Time: 12/10/21 10:23 AM  Result Value Ref Range   Preg Test, Ur Positive (A) Negative    ASSESSMENT: Positive pregnancy test, [redacted]w[redacted]d by LMP    PLAN: Schedule for dating ultrasound in 3 weeks Prenatal vitamins: plans to begin OTC ASAP   Nausea medicines: not currently needed   OB packet given: No, still has packet from last pregnancy  [redacted]w[redacted]d  12/10/2021 10:23 AM

## 2021-12-28 ENCOUNTER — Other Ambulatory Visit: Payer: Self-pay | Admitting: Obstetrics & Gynecology

## 2021-12-28 DIAGNOSIS — O3680X0 Pregnancy with inconclusive fetal viability, not applicable or unspecified: Secondary | ICD-10-CM

## 2021-12-31 ENCOUNTER — Encounter: Payer: Self-pay | Admitting: *Deleted

## 2021-12-31 ENCOUNTER — Ambulatory Visit (INDEPENDENT_AMBULATORY_CARE_PROVIDER_SITE_OTHER): Payer: Medicaid Other

## 2021-12-31 ENCOUNTER — Ambulatory Visit (INDEPENDENT_AMBULATORY_CARE_PROVIDER_SITE_OTHER): Payer: Medicaid Other | Admitting: *Deleted

## 2021-12-31 VITALS — BP 124/69 | HR 79 | Wt 202.0 lb

## 2021-12-31 DIAGNOSIS — O3680X0 Pregnancy with inconclusive fetal viability, not applicable or unspecified: Secondary | ICD-10-CM

## 2021-12-31 DIAGNOSIS — Z3A01 Less than 8 weeks gestation of pregnancy: Secondary | ICD-10-CM | POA: Diagnosis not present

## 2021-12-31 DIAGNOSIS — Z3490 Encounter for supervision of normal pregnancy, unspecified, unspecified trimester: Secondary | ICD-10-CM | POA: Insufficient documentation

## 2021-12-31 DIAGNOSIS — Z3480 Encounter for supervision of other normal pregnancy, unspecified trimester: Secondary | ICD-10-CM

## 2021-12-31 NOTE — Progress Notes (Cosign Needed Addendum)
   INITIAL OB NURSE INTAKE  SUBJECTIVE:  Wanda Craig is a 26 y.o. G73P1001 female [redacted]w[redacted]d by LMP c/w today's U/S with an Estimated Date of Delivery: 08/13/22 being seen today for her initial OB intake/educational visit with RN. She is taking prenatal vitamins.  She is not having nausea and/or vomiting and does not request nausea meds at this time.  Patient's last menstrual period was 11/06/2021.  Patient's medical, surgical, and obstetrical history obtained and reviewed.  Current medications reviewed.   Patient Active Problem List   Diagnosis Date Noted   Encounter for supervision of normal pregnancy, antepartum 12/31/2021   History of gestational hypertension 03/30/2021   History of gestational diabetes 02/19/2021   Cholelithiases 01/11/2021   Past Medical History:  Diagnosis Date   Gall stones    Gestational diabetes    Pregnancy induced hypertension    Past Surgical History:  Procedure Laterality Date   NO PAST SURGERIES     OB History     Gravida  2   Para  1   Term  1   Preterm      AB      Living  1      SAB      IAB      Ectopic      Multiple  0   Live Births  1           OBJECTIVE:  BP 124/69   Pulse 79   Wt 202 lb (91.6 kg)   LMP 11/06/2021   BMI 35.78 kg/m   ASSESSMENT/PLAN: G2P1001 at [redacted]w[redacted]d with an Estimated Date of Delivery: 08/13/22  Prenatal vitamins: continue   Nausea medicines: not currently needed   OB packet given: Yes BabyScripts and MyChart activated  Blood Pressure Cuff: has at home. Discussed to be used for virtual visits and home BP checks.  Genetic & carrier screening discussed: requests Panorama and NT/IT Placed OB Box on problem list and updated Reviewed recommended weight gain based on pre-gravid BMI BMI >=30, gain max 11-20lb  Follow-up in 5 weeks for NT U/S & New OB visit with provider  Face-to-face time at least 30 minutes. 50% or more of this visit was spent in counseling and coordination of care.  Debbe Odea  Trejon Duford RN-C 12/31/2021 5:12 PM   Chart and initial new ob nurse intake visit note reviewed and agree with plan of care. Cheral Marker CNM, [facility] 01/01/2022 11:08 AM

## 2021-12-31 NOTE — Progress Notes (Signed)
[place] 7+6 wks,single IUP with yolk sac,FHR 138 bpm,normal ovaries

## 2021-12-31 NOTE — Progress Notes (Deleted)
   INITIAL OB NURSE INTAKE  SUBJECTIVE:  Wanda Craig is a 26 y.o. G49P1001 female [redacted]w[redacted]d by {EDC:19197::"LMP c/w today's U/S","today's U/S","U/S at *** weeks"} with an Estimated Date of Delivery: 08/13/22 being seen today for her initial OB intake/educational visit with RN. She {ACTION; IS/IS SAY:30160109} taking prenatal vitamins.  She {ACTION; IS/IS NAT:55732202} having nausea and/or vomiting and {Action; does/does not:19097} request nausea meds at this time.  Patient's last menstrual period was 11/06/2021.  Patient's medical, surgical, and obstetrical history obtained and reviewed.  Current medications {Blank single:19197::"reviewed","not reviewed"}.   Patient Active Problem List   Diagnosis Date Noted   History of gestational hypertension 03/30/2021   History of gestational diabetes 02/19/2021   Cholelithiases 01/11/2021   Past Medical History:  Diagnosis Date   Gall stones    Gestational diabetes    Past Surgical History:  Procedure Laterality Date   NO PAST SURGERIES     OB History     Gravida  2   Para  1   Term  1   Preterm      AB      Living  1      SAB      IAB      Ectopic      Multiple  0   Live Births  1           OBJECTIVE:  LMP 11/06/2021   ASSESSMENT/PLAN: G2P1001 at [redacted]w[redacted]d with an Estimated Date of Delivery: 08/13/22  Prenatal vitamins: {Blank single:19197::"continue","plans to begin OTC ASAP","NEEDS RX"}   Nausea medicines: {Blank single:19197::"not currently needed","REQUESTS RX"}   OB packet given: {yes/no:20286} BabyScripts and MyChart {Blank single:19197::"activated","not activated"}  Blood Pressure Cuff: {Blank single:19197::"has at home","Rx sent today","office BP cuff given today"}. Discussed to be used for virtual visits and home BP checks.  Genetic & carrier screening discussed: {requests/declines/undecided:23839} {genetic screening:23831}, {requests/declines/undecided:23839} {genetic screening:23831} Placed OB Box on problem  list and updated Reviewed recommended weight gain based on pre-gravid BMI {Blank single:19197::"BMI <18.5, gain max 28-40lb","BMI 18.5-24.9, gain max 25-35lb","BMI 25-29.9, gain max 15-25lb","BMI >=30, gain max 11-20lb"}  Follow-up in {1-10,15,20,30:13553} {Time; days - RKYHC:62376} for {Blank single:19197::"NT U/S & New OB visit with provider","New OB visit with provider","***"}  Face-to-face time at least 30 minutes. 50% or more of this visit was spent in counseling and coordination of care.  Debbe Odea Erick Oxendine RN-C 12/31/2021 4:16 PM

## 2022-01-14 ENCOUNTER — Telehealth: Payer: Medicaid Other | Admitting: Physician Assistant

## 2022-01-14 ENCOUNTER — Encounter: Payer: Self-pay | Admitting: Women's Health

## 2022-01-14 ENCOUNTER — Other Ambulatory Visit: Payer: Self-pay | Admitting: Women's Health

## 2022-01-14 DIAGNOSIS — O219 Vomiting of pregnancy, unspecified: Secondary | ICD-10-CM

## 2022-01-14 MED ORDER — BONJESTA 20-20 MG PO TBCR: 1.0000 | EXTENDED_RELEASE_TABLET | Freq: Every day | ORAL | 8 refills | Status: DC

## 2022-01-14 NOTE — Progress Notes (Signed)
For the safety of you and your child, I recommend a face to face office visit with a health care provider.  Many mothers need to take medicines during their pregnancy and while nursing.  Almost all medicines pass into the breast milk in small quantities.  Most are generally considered safe for a mother to take but some medicines must be avoided.  After reviewing your E-Visit request, I recommend that you consult your OB/GYN or pediatrician for medical advice in relation to your condition and prescription medications while pregnant or breastfeeding.  NOTE:  There will be NO CHARGE for this eVisit  If you are having a true medical emergency please call 911.    For an urgent face to face visit, [facility] has six urgent care centers for your convenience:     [facility] [place] Get Driving Directions [phone] [address] [place], [place] [address]    [facility] ([place]) Get Driving Directions [phone] [address] [place], [place] [address]  [facility] ([place]) Get Driving Directions [phone] [address] [place],  [place]  [address]  [facility] Get Driving Directions [phone] [address], [address] [place], [place] [address]   [facility] [place] Get Driving Directions  [phone] [address] [place], [place] [address]    [facility] Get Driving Directions [phone] [address] [place], [place] [address]  Your MyChart E-visit questionnaire answers were reviewed by a board certified advanced clinical practitioner to complete your personal care plan based on your specific symptoms.  Thank you for using e-Visits.   I have spent 5 minutes in review of e-visit questionnaire, review and updating patient chart, medical decision  making and response to patient.   Jyl Chico M Eileen Croswell, PA-C  

## 2022-01-17 ENCOUNTER — Encounter: Payer: Self-pay | Admitting: Advanced Practice Midwife

## 2022-01-17 ENCOUNTER — Other Ambulatory Visit: Payer: Self-pay | Admitting: Advanced Practice Midwife

## 2022-01-17 ENCOUNTER — Encounter: Payer: Self-pay | Admitting: Obstetrics & Gynecology

## 2022-01-17 MED ORDER — DOXYLAMINE-PYRIDOXINE 10-10 MG PO TBEC: DELAYED_RELEASE_TABLET | ORAL | 6 refills | Status: DC

## 2022-01-22 ENCOUNTER — Other Ambulatory Visit: Payer: Self-pay | Admitting: Women's Health

## 2022-01-28 NOTE — L&D Delivery Note (Signed)
[facility]N [facility] Delivery Note  Wanda Craig is a 27 y.o. G2P1001 s/p vag delivery at [redacted]w[redacted]d. She was admitted for for IOL due to GDMA1, and then was dx with gHTN intrapartum.   ROM: 7h 29m with clear fluid GBS Status: neg Maximum Maternal Temperature: 98.7  Labor Progress: [name] was admitted on the morning of 08/07/22 for IOL; she received cytotec, Pitocin, and then AROM prior to progressing to complete and pushing with 3 contractions to vaginal delivery. Intrapartum she had mild range BP elevations without symptoms giving a dx of gHTN (P/C ratio still pending at this time).  Delivery Date/Time: July 11th, 2024 at 0222  Delivery: Called to room and patient was complete and pushing. Head delivered ROA. Nuchal cord present x 1; unable to reduced so somersaulted through. Shoulder and body delivered in usual fashion. Infant with minimal cry but good tone, placed on mother's abdomen, dried and stimulated. Cord clamped x 2 after 1-minute delay, and cut by FOB. Cord blood drawn. Placenta delivered spontaneously with gentle cord traction. Fundus firm with massage and Pitocin. Labia, perineum, vagina, and cervix inspected and found to be intact.   Placenta: spont, intact; to L&D Complications: none Lacerations: none EBL: 73cc Analgesia: epidural  Postpartum Planning [x]  message to sent to schedule follow-up  [x]  Lasix 20mg  x 5d [x]  fasting CBG 7/12 morning  Infant: girl  APGARs 8/9  3350g (7lb 6.2oz)  Arabella Merles, CNM  08/08/2022 2:42 AM

## 2022-02-05 ENCOUNTER — Other Ambulatory Visit: Payer: Self-pay | Admitting: Obstetrics & Gynecology

## 2022-02-05 DIAGNOSIS — Z3682 Encounter for antenatal screening for nuchal translucency: Secondary | ICD-10-CM

## 2022-02-06 ENCOUNTER — Ambulatory Visit (INDEPENDENT_AMBULATORY_CARE_PROVIDER_SITE_OTHER): Payer: Medicaid Other | Admitting: Advanced Practice Midwife

## 2022-02-06 ENCOUNTER — Encounter: Payer: Self-pay | Admitting: Advanced Practice Midwife

## 2022-02-06 ENCOUNTER — Ambulatory Visit (INDEPENDENT_AMBULATORY_CARE_PROVIDER_SITE_OTHER): Payer: BC Managed Care – PPO

## 2022-02-06 ENCOUNTER — Encounter: Payer: Medicaid Other | Admitting: *Deleted

## 2022-02-06 VITALS — BP 107/67 | HR 67 | Wt 200.0 lb

## 2022-02-06 DIAGNOSIS — Z8759 Personal history of other complications of pregnancy, childbirth and the puerperium: Secondary | ICD-10-CM

## 2022-02-06 DIAGNOSIS — O26899 Other specified pregnancy related conditions, unspecified trimester: Secondary | ICD-10-CM

## 2022-02-06 DIAGNOSIS — Z3A13 13 weeks gestation of pregnancy: Secondary | ICD-10-CM

## 2022-02-06 DIAGNOSIS — Z3682 Encounter for antenatal screening for nuchal translucency: Secondary | ICD-10-CM

## 2022-02-06 DIAGNOSIS — Z8632 Personal history of gestational diabetes: Secondary | ICD-10-CM

## 2022-02-06 DIAGNOSIS — Z6791 Unspecified blood type, Rh negative: Secondary | ICD-10-CM

## 2022-02-06 DIAGNOSIS — Z363 Encounter for antenatal screening for malformations: Secondary | ICD-10-CM

## 2022-02-06 DIAGNOSIS — Z302 Encounter for sterilization: Secondary | ICD-10-CM | POA: Insufficient documentation

## 2022-02-06 DIAGNOSIS — Z131 Encounter for screening for diabetes mellitus: Secondary | ICD-10-CM

## 2022-02-06 DIAGNOSIS — Z3480 Encounter for supervision of other normal pregnancy, unspecified trimester: Secondary | ICD-10-CM

## 2022-02-06 MED ORDER — ASPIRIN 81 MG PO CHEW: 162.0000 mg | CHEWABLE_TABLET | Freq: Every day | ORAL | 7 refills | Status: DC

## 2022-02-06 MED ORDER — BLOOD PRESSURE MONITOR MISC: 0 refills | Status: DC

## 2022-02-06 NOTE — Progress Notes (Signed)
INITIAL OBSTETRICAL VISIT Patient name: Wanda Craig MRN [id number]  Date of birth: 01/05/1996 Chief Complaint:   Initial Prenatal Visit  History of Present Illness:   Wanda Craig is a 27 y.o. G20P1001 Caucasian female at [redacted]w[redacted]d by LMP c/w u/s at 7.6 weeks with an Estimated Date of Delivery: 08/13/22 being seen today for her initial obstetrical visit.   Patient's last menstrual period was 11/06/2021. Her obstetrical history is significant for  vag del 2022 (IOL for GDMA2/gHTN) .   Today she reports no complaints.  Last pap Oct 2022. Results were: NILM w/ HRHPV negative     02/06/2022    2:28 PM 10/04/2020    9:43 AM 08/29/2020    3:07 PM  Depression screen PHQ 2/9  Decreased Interest 0 0 0  Down, Depressed, Hopeless 0 0 0  PHQ - 2 Score 0 0 0  Altered sleeping 0 0   Tired, decreased energy 0 1   Change in appetite 0 0   Feeling bad or failure about yourself  0 0   Trouble concentrating 0 0   Moving slowly or fidgety/restless 0 0   Suicidal thoughts 0 0   PHQ-9 Score 0 1         02/06/2022    2:29 PM 10/04/2020    9:43 AM  GAD 7 : Generalized Anxiety Score  Nervous, Anxious, on Edge 0 0  Control/stop worrying 0 0  Worry too much - different things 0 0  Trouble relaxing 0 0  Restless 0 0  Easily annoyed or irritable 0 0  Afraid - awful might happen 0 0  Total GAD 7 Score 0 0     Review of Systems:   Pertinent items are noted in HPI Denies cramping/contractions, leakage of fluid, vaginal bleeding, abnormal vaginal discharge w/ itching/odor/irritation, headaches, visual changes, shortness of breath, chest pain, abdominal pain, severe nausea/vomiting, or problems with urination or bowel movements unless otherwise stated above.  Pertinent History Reviewed:  Reviewed past medical,surgical, social, obstetrical and family history.  Reviewed problem list, medications and allergies. OB History  Gravida Para Term Preterm AB Living  2 1 1     1   SAB IAB Ectopic Multiple Live  Births        0 1    # Outcome Date GA Lbr Len/2nd Weight Sex Delivery Anes PTL Lv  2 Current           1 Term 04/01/21 [redacted]w[redacted]d 02:15 / 05:04 6 lb 14.4 oz (3.13 kg) M Vag-Spont EPI N LIV     Complications: Gestational diabetes, Gestational hypertension   Physical Assessment:   Vitals:   02/06/22 1433  BP: 107/67  Pulse: 67  Weight: 200 lb (90.7 kg)  Body mass index is 35.43 kg/m.       Physical Examination:  General appearance - well appearing, and in no distress  Mental status - alert, oriented to person, place, and time  Psych:  She has a normal mood and affect  Skin - warm and dry, normal color, no suspicious lesions noted  Chest - effort normal, all lung fields clear to auscultation bilaterally  Heart - normal rate and regular rhythm  Abdomen - soft, nontender  Extremities:  No swelling or varicosities noted  Pelvic - VULVA: normal appearing vulva with no masses, tenderness or lesions  VAGINA: normal appearing vagina with normal color and discharge, no lesions  CERVIX: normal appearing cervix without discharge or lesions, no CMT  Thin  prep pap is not done    TODAY'S NT [place] 13+1 wks,measurements c/w dates,CRL 60.12 mm,posterior placenta,normal ovaries,NB present,NT 1.5 mm,FHR 155 bpm   No results found for this or any previous visit (from the past 24 hour(s)).  Assessment & Plan:  1) Low-Risk Pregnancy G2P1001 at [redacted]w[redacted]d with an Estimated Date of Delivery: 08/13/22   2) Initial OB visit  3) Hx gHTN, rx bASA 162mg /day, baseline labs today  4) Hx GDMA2, early HgbA1c today  5) Rh neg, Rhogam ~28wks  6) Considering salpingectomy  Meds:  Meds ordered this encounter  Medications   Blood Pressure Monitor MISC    Sig: For regular home bp monitoring during pregnancy    Dispense:  1 each    Refill:  0    Z34.81 Please mail to patient   aspirin 81 MG chewable tablet    Sig: Chew 2 tablets (162 mg total) by mouth daily.    Dispense:  60 tablet    Refill:  7    Order  Specific Question:   Supervising Provider    Answer:   Janyth Pupa [[id number]]    Initial labs obtained Continue prenatal vitamins Reviewed n/v relief measures and warning s/s to report Reviewed recommended weight gain based on pre-gravid BMI Encouraged well-balanced diet Genetic & carrier screening discussed: requests Panorama and NT/IT, declines Horizon (neg last preg) Ultrasound discussed; fetal survey: requested [place] completed> form faxed if has or is planning to apply for medicaid The nature of [place] for [employer] with multiple MDs and other Advanced Practice Providers was explained to patient; also emphasized that fellows, residents, and students are part of our team. Does not have home bp cuff. Office bp cuff given: no. Rx sent: yes. Check bp weekly, let us know if consistently >140/90.   Indications for ASA therapy (per uptodate) One of the following: H/O preeclampsia, especially early onset/adverse outcome Yes  Indications for early A1C (per uptodate) BMI >=25 (>=23 in Asian women) AND one of the following GDM in a previous pregnancy Yes   Follow-up: Return for 4wk LROB and 2nd IT/ 7wk LROB and anatomy u/s.   Orders Placed This Encounter  Procedures   Urine Culture   GC/Chlamydia Probe Amp   US OB Comp + 14 Wk   Integrated 1   CBC/D/Plt+RPR+Rh+ABO+RubIgG...   Comprehensive metabolic panel   PANORAMA PRENATAL TEST FULL PANEL   Protein / creatinine ratio, urine   Hemoglobin A1c    Myrtis Ser [facility] 02/06/2022 3:07 PM

## 2022-02-06 NOTE — Progress Notes (Signed)
[place] 13+1 wks,measurements c/w dates,CRL 60.12 mm,posterior placenta,normal ovaries,NB present,NT 1.5 mm,FHR 155 bpm

## 2022-02-07 ENCOUNTER — Encounter: Payer: Self-pay | Admitting: Women's Health

## 2022-02-07 LAB — INTEGRATED 1

## 2022-02-08 LAB — CBC/D/PLT+RPR+RH+ABO+RUBIGG...
Antibody Screen: NEGATIVE
Basophils Absolute: 0.1 10*3/uL (ref 0.0–0.2)
Basos: 1 %
EOS (ABSOLUTE): 0.1 10*3/uL (ref 0.0–0.4)
Eos: 1 %
HCV Ab: NONREACTIVE
HIV Screen 4th Generation wRfx: NONREACTIVE
Hematocrit: 39.5 % (ref 34.0–46.6)
Hemoglobin: 13.3 g/dL (ref 11.1–15.9)
Hepatitis B Surface Ag: NEGATIVE
Immature Grans (Abs): 0.1 10*3/uL (ref 0.0–0.1)
Immature Granulocytes: 1 %
Lymphocytes Absolute: 2.6 10*3/uL (ref 0.7–3.1)
Lymphs: 24 %
MCH: 31.1 pg (ref 26.6–33.0)
MCHC: 33.7 g/dL (ref 31.5–35.7)
MCV: 92 fL (ref 79–97)
Monocytes Absolute: 0.3 10*3/uL (ref 0.1–0.9)
Monocytes: 3 %
Neutrophils Absolute: 7.8 10*3/uL — ABNORMAL HIGH (ref 1.4–7.0)
Neutrophils: 70 %
Platelets: 374 10*3/uL (ref 150–450)
RBC: 4.28 x10E6/uL (ref 3.77–5.28)
RDW: 12.3 % (ref 11.7–15.4)
RPR Ser Ql: NONREACTIVE
Rh Factor: NEGATIVE
Rubella Antibodies, IGG: 8.75 index (ref >0.99)
WBC: 10.9 10*3/uL — ABNORMAL HIGH (ref 3.4–10.8)

## 2022-02-08 LAB — COMPREHENSIVE METABOLIC PANEL
ALT: 77 IU/L — ABNORMAL HIGH (ref 0–32)
AST: 37 IU/L (ref 0–40)
Albumin/Globulin Ratio: 1.5 (ref 1.2–2.2)
Albumin: 4.3 g/dL (ref 4.0–5.0)
Alkaline Phosphatase: 120 IU/L (ref 44–121)
BUN/Creatinine Ratio: 13 (ref 9–23)
BUN: 6 mg/dL (ref 6–20)
Bilirubin Total: 0.2 mg/dL (ref 0.0–1.2)
CO2: 22 mmol/L (ref 20–29)
Calcium: 9.5 mg/dL (ref 8.7–10.2)
Chloride: 100 mmol/L (ref 96–106)
Creatinine, Ser: 0.48 mg/dL — ABNORMAL LOW (ref 0.57–1.00)
Globulin, Total: 2.8 g/dL (ref 1.5–4.5)
Glucose: 81 mg/dL (ref 70–99)
Potassium: 4.2 mmol/L (ref 3.5–5.2)
Sodium: 137 mmol/L (ref 134–144)
Total Protein: 7.1 g/dL (ref 6.0–8.5)
eGFR: 133 mL/min/{1.73_m2} (ref >59)

## 2022-02-08 LAB — INTEGRATED 1
Crown Rump Length: 60.1 mm
Gest. Age on Collection Date: 12.3 weeks
Maternal Age at EDD: 27.7 yr
Nuchal Translucency (NT): 1.5 mm
Number of Fetuses: 1
PAPP-A Value: 792.8 ng/mL
Weight: 200 [lb_av]

## 2022-02-08 LAB — HCV INTERPRETATION

## 2022-02-08 LAB — PROTEIN / CREATININE RATIO, URINE
Creatinine, Urine: 25.2 mg/dL
Protein, Ur: 5.2 mg/dL
Protein/Creat Ratio: 206 mg/g creat — ABNORMAL HIGH (ref 0–200)

## 2022-02-08 LAB — HEMOGLOBIN A1C
Est. average glucose Bld gHb Est-mCnc: 108 mg/dL
Hgb A1c MFr Bld: 5.4 % (ref 4.8–5.6)

## 2022-02-08 LAB — URINE CULTURE

## 2022-02-09 LAB — GC/CHLAMYDIA PROBE AMP
Chlamydia trachomatis, NAA: NEGATIVE
Neisseria Gonorrhoeae by PCR: NEGATIVE

## 2022-02-13 LAB — PANORAMA PRENATAL TEST FULL PANEL:PANORAMA TEST PLUS 5 ADDITIONAL MICRODELETIONS: FETAL FRACTION: 6.1

## 2022-03-06 ENCOUNTER — Ambulatory Visit (INDEPENDENT_AMBULATORY_CARE_PROVIDER_SITE_OTHER): Payer: Medicaid Other | Admitting: Advanced Practice Midwife

## 2022-03-06 VITALS — BP 131/80 | HR 84 | Wt 202.0 lb

## 2022-03-06 DIAGNOSIS — Z3A17 17 weeks gestation of pregnancy: Secondary | ICD-10-CM

## 2022-03-06 DIAGNOSIS — Z3480 Encounter for supervision of other normal pregnancy, unspecified trimester: Secondary | ICD-10-CM

## 2022-03-06 DIAGNOSIS — Z3482 Encounter for supervision of other normal pregnancy, second trimester: Secondary | ICD-10-CM

## 2022-03-06 NOTE — Patient Instructions (Signed)
Prisila, thank you for choosing our office today! We appreciate the opportunity to meet your healthcare needs. You may receive a short survey by mail, e-mail, or through MyChart. If you are happy with your care we would appreciate if you could take just a few minutes to complete the survey questions. We read all of your comments and take your feedback very seriously. Thank you again for choosing our office.  [facility] Team at [facility] ([address] [place], [place] [address]) Entrance C, located off of [address] Free 24/7 valet parking  Go to [email or web address] to register for FREE online childbirth classes  Call the office ([phone]) or go to [facility] if: You begin to severe cramping Your water breaks.  Sometimes it is a big gush of fluid, sometimes it is just a trickle that keeps getting your panties wet or running down your legs You have vaginal bleeding.  It is normal to have a small amount of spotting if your cervix was checked.   [place] Pediatricians/Family Doctors [facility] ([facility]): [address], [phone]           [facility]: [address], [phone]                [facility] ([facility]): [address], [phone] (call to ask if accepting patients) [facility]: [address], [place], [phone]    [place] Pediatricians/Family Doctors [facility] ([facility]): [address], [phone] [facility]: [address], [phone] [facility]: [address], [phone]  [place] [facility]  [facility] ([facility]): [phone] [facility]: [address], [phone]   [place] Family Doctors [facility]: [address], [phone]  [place] [facility]  [facility]: [address], [phone]  Home Blood Pressure Monitoring for Patients   Your provider has recommended that you check your blood pressure (BP) at least once a week at home. If you do not have a blood pressure cuff at home, one will be provided for you. Contact your provider if you have not received your monitor within 1 week.   Helpful Tips for Accurate Home Blood Pressure Checks  Don't smoke, exercise, or drink caffeine 30 minutes before checking your BP Use the restroom before checking your BP (a full bladder can raise your pressure) Relax in a comfortable upright chair Feet on the ground Left arm resting comfortably on a flat surface at the level of your heart Legs uncrossed Back supported Sit quietly and don't talk Place the cuff on your bare arm Adjust snuggly, so that only two fingertips can fit between your skin and the top of the cuff Check 2 readings separated by at least one minute Keep a log of your BP readings For a visual, please reference this diagram: http://ccnc.care/bpdiagram  Provider Name: [facility]     Phone: [phone]  Zone 1: ALL CLEAR  Continue to monitor your symptoms:  BP reading is less than 140 (top number) or less than 90 (bottom number)  No right upper stomach pain No headaches or seeing spots No feeling nauseated or throwing up No swelling in face and hands  Zone 2: CAUTION Call your doctor's office for any of the following:  BP reading is greater than 140 (top number) or greater than   90 (bottom number)  Stomach pain under your ribs in the middle or right side Headaches or seeing spots Feeling nauseated or throwing up Swelling in face and hands  Zone 3: EMERGENCY  Seek immediate medical care if you have any of the following:  BP reading is greater than160 (top number) or greater than 110 (bottom number) Severe headaches not improving with Tylenol Serious difficulty catching your breath Any worsening symptoms from  Zone 2     Second Trimester of Pregnancy The second trimester is from week 14 through week 27 (months 4 through 6). The second trimester is often a time when you feel your best. Your body has adjusted to being pregnant, and you begin to feel better physically. Usually, morning sickness has lessened or quit completely, you may have more energy, and you may have an increase in appetite. The second trimester is also a time when the fetus is growing rapidly. At the end of the sixth month, the fetus is about 9 inches long and weighs about 1 pounds. You will likely begin to feel the baby move (quickening) between 16 and 20 weeks of pregnancy. Body changes during your second trimester Your body continues to go through many changes during your second trimester. The changes vary from woman to woman. Your weight will continue to increase. You will notice your lower abdomen bulging out. You may begin to get stretch marks on your hips, abdomen, and breasts. You may develop headaches that can be relieved by medicines. The medicines should be approved by your health care provider. You may urinate more often because the fetus is pressing on your bladder. You may develop or continue to have heartburn as a result of your pregnancy. You may develop constipation because certain hormones are causing the muscles that push waste through your intestines to slow down. You may develop hemorrhoids or swollen, bulging veins (varicose veins). You may have back pain. This is caused by: Weight gain. Pregnancy hormones that are relaxing the joints in your pelvis. A shift in weight and the muscles that support your balance. Your breasts will continue to grow and they will continue to become tender. Your gums may bleed and may be sensitive to brushing and flossing. Dark spots or blotches (chloasma, mask of pregnancy) may develop on your face. This will likely fade after the baby is born. A dark line from your belly button to  the pubic area (linea nigra) may appear. This will likely fade after the baby is born. You may have changes in your hair. These can include thickening of your hair, rapid growth, and changes in texture. Some women also have hair loss during or after pregnancy, or hair that feels dry or thin. Your hair will most likely return to normal after your baby is born.  What to expect at prenatal visits During a routine prenatal visit: You will be weighed to make sure you and the fetus are growing normally. Your blood pressure will be taken. Your abdomen will be measured to track your baby's growth. The fetal heartbeat will be listened to. Any test results from the previous visit will be discussed.  Your health care provider may ask you: How you are feeling. If you are feeling the baby move. If you have had any abnormal symptoms, such as leaking fluid, bleeding, severe headaches, or abdominal cramping. If you are using any tobacco products, including cigarettes, chewing tobacco, and electronic cigarettes. If you have any questions.  Other tests that may be performed during  your second trimester include: Blood tests that check for: Low iron levels (anemia). High blood sugar that affects pregnant women (gestational diabetes) between 41 and 28 weeks. Rh antibodies. This is to check for a protein on red blood cells (Rh factor). Urine tests to check for infections, diabetes, or protein in the urine. An ultrasound to confirm the proper growth and development of the baby. An amniocentesis to check for possible genetic problems. Fetal screens for spina bifida and Down syndrome. HIV (human immunodeficiency virus) testing. Routine prenatal testing includes screening for HIV, unless you choose not to have this test.  Follow these instructions at home: Medicines Follow your health care provider's instructions regarding medicine use. Specific medicines may be either safe or unsafe to take during  pregnancy. Take a prenatal vitamin that contains at least 600 micrograms (mcg) of folic acid. If you develop constipation, try taking a stool softener if your health care provider approves. Eating and drinking Eat a balanced diet that includes fresh fruits and vegetables, whole grains, good sources of protein such as meat, eggs, or tofu, and low-fat dairy. Your health care provider will help you determine the amount of weight gain that is right for you. Avoid raw meat and uncooked cheese. These carry germs that can cause birth defects in the baby. If you have low calcium intake from food, talk to your health care provider about whether you should take a daily calcium supplement. Limit foods that are high in fat and processed sugars, such as fried and sweet foods. To prevent constipation: Drink enough fluid to keep your urine clear or pale yellow. Eat foods that are high in fiber, such as fresh fruits and vegetables, whole grains, and beans. Activity Exercise only as directed by your health care provider. Most women can continue their usual exercise routine during pregnancy. Try to exercise for 30 minutes at least 5 days a week. Stop exercising if you experience uterine contractions. Avoid heavy lifting, wear low heel shoes, and practice good posture. A sexual relationship may be continued unless your health care provider directs you otherwise. Relieving pain and discomfort Wear a good support bra to prevent discomfort from breast tenderness. Take warm sitz baths to soothe any pain or discomfort caused by hemorrhoids. Use hemorrhoid cream if your health care provider approves. Rest with your legs elevated if you have leg cramps or low back pain. If you develop varicose veins, wear support hose. Elevate your feet for 15 minutes, 3-4 times a day. Limit salt in your diet. Prenatal Care Write down your questions. Take them to your prenatal visits. Keep all your prenatal visits as told by your health  care provider. This is important. Safety Wear your seat belt at all times when driving. Make a list of emergency phone numbers, including numbers for family, friends, the hospital, and police and fire departments. General instructions Ask your health care provider for a referral to a local prenatal education class. Begin classes no later than the beginning of month 6 of your pregnancy. Ask for help if you have counseling or nutritional needs during pregnancy. Your health care provider can offer advice or refer you to specialists for help with various needs. Do not use hot tubs, steam rooms, or saunas. Do not douche or use tampons or scented sanitary pads. Do not cross your legs for long periods of time. Avoid cat litter boxes and soil used by cats. These carry germs that can cause birth defects in the baby and possibly loss of the  fetus by miscarriage or stillbirth. Avoid all smoking, herbs, alcohol, and unprescribed drugs. Chemicals in these products can affect the formation and growth of the baby. Do not use any products that contain nicotine or tobacco, such as cigarettes and e-cigarettes. If you need help quitting, ask your health care provider. Visit your dentist if you have not gone yet during your pregnancy. Use a soft toothbrush to brush your teeth and be gentle when you floss. Contact a health care provider if: You have dizziness. You have mild pelvic cramps, pelvic pressure, or nagging pain in the abdominal area. You have persistent nausea, vomiting, or diarrhea. You have a bad smelling vaginal discharge. You have pain when you urinate. Get help right away if: You have a fever. You are leaking fluid from your vagina. You have spotting or bleeding from your vagina. You have severe abdominal cramping or pain. You have rapid weight gain or weight loss. You have shortness of breath with chest pain. You notice sudden or extreme swelling of your face, hands, ankles, feet, or legs. You  have not felt your baby move in over an hour. You have severe headaches that do not go away when you take medicine. You have vision changes. Summary The second trimester is from week 14 through week 27 (months 4 through 6). It is also a time when the fetus is growing rapidly. Your body goes through many changes during pregnancy. The changes vary from woman to woman. Avoid all smoking, herbs, alcohol, and unprescribed drugs. These chemicals affect the formation and growth your baby. Do not use any tobacco products, such as cigarettes, chewing tobacco, and e-cigarettes. If you need help quitting, ask your health care provider. Contact your health care provider if you have any questions. Keep all prenatal visits as told by your health care provider. This is important. This information is not intended to replace advice given to you by your health care provider. Make sure you discuss any questions you have with your health care provider. Document Released: 01/08/2001 Document Revised: 06/22/2015 Document Reviewed: 03/17/2012 Elsevier Interactive Patient Education  2017 [employer].

## 2022-03-06 NOTE — Progress Notes (Signed)
   LOW-RISK PREGNANCY VISIT Patient name: Wanda Craig MRN [id number]  Date of birth: 1995/09/06 Chief Complaint:   Routine Prenatal Visit (2nd it)  History of Present Illness:   Wanda Craig is a 27 y.o. G54P1001 female at [redacted]w[redacted]d with an Estimated Date of Delivery: 08/13/22 being seen today for ongoing management of a low-risk pregnancy.  Today she reports no complaints. Contractions: Not present. Vag. Bleeding: None.  Movement: Present. denies leaking of fluid. Review of Systems:   Pertinent items are noted in HPI Denies abnormal vaginal discharge w/ itching/odor/irritation, headaches, visual changes, shortness of breath, chest pain, abdominal pain, severe nausea/vomiting, or problems with urination or bowel movements unless otherwise stated above. Pertinent History Reviewed:  Reviewed past medical,surgical, social, obstetrical and family history.  Reviewed problem list, medications and allergies. Physical Assessment:   Vitals:   03/06/22 1334  BP: 131/80  Pulse: 84  Weight: 202 lb (91.6 kg)  Body mass index is 35.78 kg/m.        Physical Examination:   General appearance: Well appearing, and in no distress  Mental status: Alert, oriented to person, place, and time  Skin: Warm & dry  Cardiovascular: Normal heart rate noted  Respiratory: Normal respiratory effort, no distress  Abdomen: Soft, gravid, nontender  Pelvic: Cervical exam deferred         Extremities:    Fetal Status: Fetal Heart Rate (bpm): 162   Movement: Present    No results found for this or any previous visit (from the past 24 hour(s)).  Assessment & Plan:  1) Low-risk pregnancy G2P1001 at [redacted]w[redacted]d with an Estimated Date of Delivery: 08/13/22   2) Hx GDM, nl early HgbA1c  3) Hx gHTN, taking bASA; ALT 77, will repeat w PN2   Meds: No orders of the defined types were placed in this encounter.  Labs/procedures today: 2nd IT  Plan:  Continue routine obstetrical care   Reviewed: Preterm labor symptoms and  general obstetric precautions including but not limited to vaginal bleeding, contractions, leaking of fluid and fetal movement were reviewed in detail with the patient.  All questions were answered. Didn't ask about home bp cuff.  Check bp weekly, let us know if >140/90.   Follow-up: Return for As scheduled.(Anatomy u/s)  Orders Placed This Encounter  Procedures   INTEGRATED 2   Myrtis Ser [facility] 03/06/2022 1:56 PM

## 2022-03-08 LAB — INTEGRATED 2
AFP MoM: 1.29
Alpha-Fetoprotein: 34.5 ng/mL
Crown Rump Length: 60.1 mm
DIA MoM: 0.72
DIA Value: 93.6 pg/mL
Estriol, Unconjugated: 0.66 ng/mL
Gest. Age on Collection Date: 12.3 wk
Gestational Age: 16.3 wk
Maternal Age at EDD: 27.7 a
Nuchal Translucency (NT): 1.5 mm
Nuchal Translucency MoM: 1.12
Number of Fetuses: 1
PAPP-A MoM: 1.19
PAPP-A Value: 792.8 ng/mL
Test Results:: NEGATIVE
Weight: 200 [lb_av]
Weight: 200 [lb_av]
hCG MoM: 0.73
hCG Value: 21.3 [IU]/mL
uE3 MoM: 0.72

## 2022-03-27 ENCOUNTER — Ambulatory Visit (INDEPENDENT_AMBULATORY_CARE_PROVIDER_SITE_OTHER): Payer: Medicaid Other | Admitting: Advanced Practice Midwife

## 2022-03-27 ENCOUNTER — Encounter: Payer: Self-pay | Admitting: Advanced Practice Midwife

## 2022-03-27 ENCOUNTER — Ambulatory Visit (INDEPENDENT_AMBULATORY_CARE_PROVIDER_SITE_OTHER): Payer: BC Managed Care – PPO

## 2022-03-27 VITALS — BP 124/65 | HR 72 | Wt 203.0 lb

## 2022-03-27 DIAGNOSIS — Z363 Encounter for antenatal screening for malformations: Secondary | ICD-10-CM | POA: Diagnosis not present

## 2022-03-27 DIAGNOSIS — Z3A20 20 weeks gestation of pregnancy: Secondary | ICD-10-CM

## 2022-03-27 DIAGNOSIS — O4442 Low lying placenta NOS or without hemorrhage, second trimester: Secondary | ICD-10-CM

## 2022-03-27 DIAGNOSIS — Z302 Encounter for sterilization: Secondary | ICD-10-CM

## 2022-03-27 DIAGNOSIS — Z3480 Encounter for supervision of other normal pregnancy, unspecified trimester: Secondary | ICD-10-CM

## 2022-03-27 DIAGNOSIS — O4440 Low lying placenta NOS or without hemorrhage, unspecified trimester: Secondary | ICD-10-CM | POA: Insufficient documentation

## 2022-03-27 NOTE — Progress Notes (Signed)
   LOW-RISK PREGNANCY VISIT Patient name: Wanda Craig MRN [id number]  Date of birth: 1995-07-10 Chief Complaint:   Routine Prenatal Visit  History of Present Illness:   TASHEKA Craig is a 27 y.o. G57P1001 female at 48w1dwith an Estimated Date of Delivery: 08/13/22 being seen today for ongoing management of a low-risk pregnancy.  Today she reports no complaints. Contractions: Not present. Vag. Bleeding: None.  Movement: Present. denies leaking of fluid. Review of Systems:   Pertinent items are noted in HPI Denies abnormal vaginal discharge w/ itching/odor/irritation, headaches, visual changes, shortness of breath, chest pain, abdominal pain, severe nausea/vomiting, or problems with urination or bowel movements unless otherwise stated above. Pertinent History Reviewed:  Reviewed past medical,surgical, social, obstetrical and family history.  Reviewed problem list, medications and allergies. Physical Assessment:   Vitals:   03/27/22 1141  BP: 124/65  Pulse: 72  Weight: 203 lb (92.1 kg)  Body mass index is 35.96 kg/m.        Physical Examination:   General appearance: Well appearing, and in no distress  Mental status: Alert, oriented to person, place, and time  Skin: Warm & dry  Cardiovascular: Normal heart rate noted  Respiratory: Normal respiratory effort, no distress  Abdomen: Soft, gravid, nontender  Pelvic: Cervical exam deferred         Extremities: Edema: None  Fetal Status: Fetal Heart Rate (bpm): 144 u/s   Movement: Present    Anatomy u/s: [place]20+1 wks,breech,posterior low lying placenta gr 0,edge of placenta to cx 1.3 cm,FHR 144 bpm,LVEICF,SVP of fluid 4.7 cm,EFW 303 g 20%,anatomy complete   No results found for this or any previous visit (from the past 24 hour(s)).  Assessment & Plan:  1) Low-risk pregnancy G2P1001 at 211w1dith an Estimated Date of Delivery: 08/13/22   2) LLP, edge of placenta 13cm from os; pelvic rest; f/u 28 wks (scheduled)  3) Hx GDM and gHTN    Meds: No orders of the defined types were placed in this encounter.  Labs/procedures today: anatomy u/s  Plan:  Continue routine obstetrical care   Reviewed: Preterm labor symptoms and general obstetric precautions including but not limited to vaginal bleeding, contractions, leaking of fluid and fetal movement were reviewed in detail with the patient.  All questions were answered. Has home bp cuff. Check bp weekly, let usKoreanow if >140/90.   Follow-up: Return for 4wk LROB; 7wk LROB/us follow up/PN2.  Orders Placed This Encounter  Procedures   [place] Follow Up   [facility]Myrtis SerNHouston [facility] LLC/28/2024 11:48 AM

## 2022-03-27 NOTE — Progress Notes (Signed)
[place] 20+1 wks,breech,posterior low lying placenta gr 0,edge of placenta to cx 1.3 cm,FHR 144 bpm,LVEICF,SVP of fluid 4.7 cm,EFW 303 g 20%,anatomy complete

## 2022-03-27 NOTE — Patient Instructions (Addendum)
Wanda Craig, thank you for choosing our office today! We appreciate the opportunity to meet your healthcare needs. You may receive a short survey by mail, e-mail, or through MyChart. If you are happy with your care we would appreciate if you could take just a few minutes to complete the survey questions. We read all of your comments and take your feedback very seriously. Thank you again for choosing our office.  [facility] Team at [facility] ([address] [place], [place] [address]) Entrance C, located off of [address] Free 24/7 valet parking  Go to [email or web address] to register for FREE online childbirth classes  Call the office ([phone]) or go to [facility] if: You begin to severe cramping Your water breaks.  Sometimes it is a big gush of fluid, sometimes it is just a trickle that keeps getting your panties wet or running down your legs You have vaginal bleeding.  It is normal to have a small amount of spotting if your cervix was checked.   [place] [facility]/Family Doctors [facility] ([facility]): [address], [phone]           [facility]: [address], [phone]                [facility] ([facility]): [address], [phone] (call to ask if accepting patients) [facility]: [address], [place], [phone]    [place] [facility]/Family Doctors [facility] ([facility]): [address], [phone] [facility]: [address], [phone] [facility]: [address], [phone]  [place] [facility]  [facility] ([facility]): [phone] [facility]: [address], [phone]   [place] [facility]: [address], [phone]  [place] [facility]  [facility]: [address], [phone]  Home Blood Pressure Monitoring for Patients   Your provider has recommended that you check your blood pressure (BP) at least once a week at home. If you do not have a blood pressure cuff at home, one will be provided for you. Contact your provider if you have not received your monitor within 1 week.   Helpful Tips for Accurate Home Blood Pressure Checks  Don't smoke, exercise, or drink caffeine 30 minutes before checking your BP Use the restroom before checking your BP (a full bladder can raise your pressure) Relax in a comfortable upright chair Feet on the ground Left arm resting comfortably on a flat surface at the level of your heart Legs uncrossed Back supported Sit quietly and don't talk Place the cuff on your bare arm Adjust snuggly, so that only two fingertips can fit between your skin and the top of the cuff Check 2 readings separated by at least one minute Keep a log of your BP readings For a visual, please reference this diagram: http://[facility].care/bpdiagram  Provider Name: [facility]     Phone: [phone]  Zone 1: ALL CLEAR  Continue to monitor your symptoms:  BP reading is less than 140 (top number) or less than 90 (bottom number)  No right upper stomach pain No headaches or seeing spots No feeling nauseated or throwing up No swelling in face and hands  Zone 2: CAUTION Call your doctor's office for any of the following:  BP reading is greater than 140 (top number) or greater than   90 (bottom number)  Stomach pain under your ribs in the middle or right side Headaches or seeing spots Feeling nauseated or throwing up Swelling in face and hands  Zone 3: EMERGENCY  Seek immediate medical care if you have any of the following:  BP reading is greater than160 (top number) or greater than 110 (bottom number) Severe headaches not improving with Tylenol Serious difficulty catching your breath Any worsening symptoms from  Zone 2     Second Trimester of Pregnancy The second trimester is from week 14 through week 27 (months 4 through 6). The second trimester is often a time when you feel your best. Your body has adjusted to being pregnant, and you begin to feel better physically. Usually, morning sickness has lessened or quit completely, you may have more energy, and you may have an increase in appetite. The second trimester is also a time when the fetus is growing rapidly. At the end of the sixth month, the fetus is about 9 inches long and weighs about 1 pounds. You will likely begin to feel the baby move (quickening) between 16 and 20 weeks of pregnancy. Body changes during your second trimester Your body continues to go through many changes during your second trimester. The changes vary from woman to woman. Your weight will continue to increase. You will notice your lower abdomen bulging out. You may begin to get stretch marks on your hips, abdomen, and breasts. You may develop headaches that can be relieved by medicines. The medicines should be approved by your health care provider. You may urinate more often because the fetus is pressing on your bladder. You may develop or continue to have heartburn as a result of your pregnancy. You may develop constipation because certain hormones are causing the muscles that push waste through your intestines to slow down. You may develop hemorrhoids or swollen, bulging veins (varicose veins). You may have back pain. This is caused by: Weight gain. Pregnancy hormones that are relaxing the joints in your pelvis. A shift in weight and the muscles that support your balance. Your breasts will continue to grow and they will continue to become tender. Your gums may bleed and may be sensitive to brushing and flossing. Dark spots or blotches (chloasma, mask of pregnancy) may develop on your face. This will likely fade after the baby is born. A dark line from your belly button to  the pubic area (linea nigra) may appear. This will likely fade after the baby is born. You may have changes in your hair. These can include thickening of your hair, rapid growth, and changes in texture. Some women also have hair loss during or after pregnancy, or hair that feels dry or thin. Your hair will most likely return to normal after your baby is born.  What to expect at prenatal visits During a routine prenatal visit: You will be weighed to make sure you and the fetus are growing normally. Your blood pressure will be taken. Your abdomen will be measured to track your baby's growth. The fetal heartbeat will be listened to. Any test results from the previous visit will be discussed.  Your health care provider may ask you: How you are feeling. If you are feeling the baby move. If you have had any abnormal symptoms, such as leaking fluid, bleeding, severe headaches, or abdominal cramping. If you are using any tobacco products, including cigarettes, chewing tobacco, and electronic cigarettes. If you have any questions.  Other tests that may be performed during   your second trimester include: Blood tests that check for: Low iron levels (anemia). High blood sugar that affects pregnant women (gestational diabetes) between 24 and 28 weeks. Rh antibodies. This is to check for a protein on red blood cells (Rh factor). Urine tests to check for infections, diabetes, or protein in the urine. An ultrasound to confirm the proper growth and development of the baby. An amniocentesis to check for possible genetic problems. Fetal screens for spina bifida and Down syndrome. HIV (human immunodeficiency virus) testing. Routine prenatal testing includes screening for HIV, unless you choose not to have this test.  Follow these instructions at home: Medicines Follow your health care provider's instructions regarding medicine use. Specific medicines may be either safe or unsafe to take during  pregnancy. Take a prenatal vitamin that contains at least 600 micrograms (mcg) of folic acid. If you develop constipation, try taking a stool softener if your health care provider approves. Eating and drinking Eat a balanced diet that includes fresh fruits and vegetables, whole grains, good sources of protein such as meat, eggs, or tofu, and low-fat dairy. Your health care provider will help you determine the amount of weight gain that is right for you. Avoid raw meat and uncooked cheese. These carry germs that can cause birth defects in the baby. If you have low calcium intake from food, talk to your health care provider about whether you should take a daily calcium supplement. Limit foods that are high in fat and processed sugars, such as fried and sweet foods. To prevent constipation: Drink enough fluid to keep your urine clear or pale yellow. Eat foods that are high in fiber, such as fresh fruits and vegetables, whole grains, and beans. Activity Exercise only as directed by your health care provider. Most women can continue their usual exercise routine during pregnancy. Try to exercise for 30 minutes at least 5 days a week. Stop exercising if you experience uterine contractions. Avoid heavy lifting, wear low heel shoes, and practice good posture. A sexual relationship may be continued unless your health care provider directs you otherwise. Relieving pain and discomfort Wear a good support bra to prevent discomfort from breast tenderness. Take warm sitz baths to soothe any pain or discomfort caused by hemorrhoids. Use hemorrhoid cream if your health care provider approves. Rest with your legs elevated if you have leg cramps or low back pain. If you develop varicose veins, wear support hose. Elevate your feet for 15 minutes, 3-4 times a day. Limit salt in your diet. Prenatal Care Write down your questions. Take them to your prenatal visits. Keep all your prenatal visits as told by your health  care provider. This is important. Safety Wear your seat belt at all times when driving. Make a list of emergency phone numbers, including numbers for family, friends, the hospital, and police and fire departments. General instructions Ask your health care provider for a referral to a local prenatal education class. Begin classes no later than the beginning of month 6 of your pregnancy. Ask for help if you have counseling or nutritional needs during pregnancy. Your health care provider can offer advice or refer you to specialists for help with various needs. Do not use hot tubs, steam rooms, or saunas. Do not douche or use tampons or scented sanitary pads. Do not cross your legs for long periods of time. Avoid cat litter boxes and soil used by cats. These carry germs that can cause birth defects in the baby and possibly loss of the   fetus by miscarriage or stillbirth. Avoid all smoking, herbs, alcohol, and unprescribed drugs. Chemicals in these products can affect the formation and growth of the baby. Do not use any products that contain nicotine or tobacco, such as cigarettes and e-cigarettes. If you need help quitting, ask your health care provider. Visit your dentist if you have not gone yet during your pregnancy. Use a soft toothbrush to brush your teeth and be gentle when you floss. Contact a health care provider if: You have dizziness. You have mild pelvic cramps, pelvic pressure, or nagging pain in the abdominal area. You have persistent nausea, vomiting, or diarrhea. You have a bad smelling vaginal discharge. You have pain when you urinate. Get help right away if: You have a fever. You are leaking fluid from your vagina. You have spotting or bleeding from your vagina. You have severe abdominal cramping or pain. You have rapid weight gain or weight loss. You have shortness of breath with chest pain. You notice sudden or extreme swelling of your face, hands, ankles, feet, or legs. You  have not felt your baby move in over an hour. You have severe headaches that do not go away when you take medicine. You have vision changes. Summary The second trimester is from week 14 through week 27 (months 4 through 6). It is also a time when the fetus is growing rapidly. Your body goes through many changes during pregnancy. The changes vary from woman to woman. Avoid all smoking, herbs, alcohol, and unprescribed drugs. These chemicals affect the formation and growth your baby. Do not use any tobacco products, such as cigarettes, chewing tobacco, and e-cigarettes. If you need help quitting, ask your health care provider. Contact your health care provider if you have any questions. Keep all prenatal visits as told by your health care provider. This is important. This information is not intended to replace advice given to you by your health care provider. Make sure you discuss any questions you have with your health care provider. Document Released: 01/08/2001 Document Revised: 06/22/2015 Document Reviewed: 03/17/2012 Elsevier Interactive Patient Education  2017 [employer]  

## 2022-04-23 ENCOUNTER — Ambulatory Visit (INDEPENDENT_AMBULATORY_CARE_PROVIDER_SITE_OTHER): Payer: BC Managed Care – PPO | Admitting: Women's Health

## 2022-04-23 ENCOUNTER — Encounter: Payer: Self-pay | Admitting: Women's Health

## 2022-04-23 VITALS — BP 114/72 | HR 81 | Wt 209.0 lb

## 2022-04-23 DIAGNOSIS — Z3A24 24 weeks gestation of pregnancy: Secondary | ICD-10-CM

## 2022-04-23 DIAGNOSIS — O4440 Low lying placenta NOS or without hemorrhage, unspecified trimester: Secondary | ICD-10-CM

## 2022-04-23 DIAGNOSIS — Z3482 Encounter for supervision of other normal pregnancy, second trimester: Secondary | ICD-10-CM

## 2022-04-23 DIAGNOSIS — O4442 Low lying placenta NOS or without hemorrhage, second trimester: Secondary | ICD-10-CM

## 2022-04-23 DIAGNOSIS — Z3480 Encounter for supervision of other normal pregnancy, unspecified trimester: Secondary | ICD-10-CM

## 2022-04-23 NOTE — Progress Notes (Signed)
LOW-RISK PREGNANCY VISIT Patient name: Wanda Craig MRN [id number]  Date of birth: [birth year] Chief Complaint:   Routine Prenatal Visit  History of Present Illness:   Wanda Craig is a 27 y.o. G60P1001 female at [redacted]w[redacted]d with an Estimated Date of Delivery: 08/13/22 being seen today for ongoing management of a low-risk pregnancy.   Today she reports no complaints. Contractions: Not present. Vag. Bleeding: None.  Movement: Present. denies leaking of fluid.     02/06/2022    2:28 PM 10/04/2020    9:43 AM 08/29/2020    3:07 PM  Depression screen PHQ 2/9  Decreased Interest 0 0 0  Down, Depressed, Hopeless 0 0 0  PHQ - 2 Score 0 0 0  Altered sleeping 0 0   Tired, decreased energy 0 1   Change in appetite 0 0   Feeling bad or failure about yourself  0 0   Trouble concentrating 0 0   Moving slowly or fidgety/restless 0 0   Suicidal thoughts 0 0   PHQ-9 Score 0 1         02/06/2022    2:29 PM 10/04/2020    9:43 AM  GAD 7 : Generalized Anxiety Score  Nervous, Anxious, on Edge 0 0  Control/stop worrying 0 0  Worry too much - different things 0 0  Trouble relaxing 0 0  Restless 0 0  Easily annoyed or irritable 0 0  Afraid - awful might happen 0 0  Total GAD 7 Score 0 0      Review of Systems:   Pertinent items are noted in HPI Denies abnormal vaginal discharge w/ itching/odor/irritation, headaches, visual changes, shortness of breath, chest pain, abdominal pain, severe nausea/vomiting, or problems with urination or bowel movements unless otherwise stated above. Pertinent History Reviewed:  Reviewed past medical,surgical, social, obstetrical and family history.  Reviewed problem list, medications and allergies. Physical Assessment:   Vitals:   04/23/22 1116  BP: 114/72  Pulse: 81  Weight: 209 lb (94.8 kg)  Body mass index is 37.02 kg/m.        Physical Examination:   General appearance: Well appearing, and in no distress  Mental status: Alert, oriented to person, place,  and time  Skin: Warm & dry  Cardiovascular: Normal heart rate noted  Respiratory: Normal respiratory effort, no distress  Abdomen: Soft, gravid, nontender  Pelvic: Cervical exam deferred         Extremities: Edema: None  Fetal Status: Fetal Heart Rate (bpm): 134 Fundal Height: 24 cm Movement: Present    Chaperone: N/A   No results found for this or any previous visit (from the past 24 hour(s)).  Assessment & Plan:  1) Low-risk pregnancy G2P1001 at [redacted]w[redacted]d with an Estimated Date of Delivery: 08/13/22   2) H/O GDM, early A1C wnl, GTT next visit  3) H/O GHTN> ASA, bp cuff given today  4) Low lying placenta> 1.3cm from os, f/u u/s next visit   Meds: No orders of the defined types were placed in this encounter.  Labs/procedures today: none  Plan:  Continue routine obstetrical care  Next visit: prefers will be in person for pn2, u/s     Reviewed: Preterm labor symptoms and general obstetric precautions including but not limited to vaginal bleeding, contractions, leaking of fluid and fetal movement were reviewed in detail with the patient.  All questions were answered. Does not have home bp cuff. Office bp cuff given: yes. Check bp weekly, let us know if  consistently >140 and/or >90.  Follow-up: Return in about 4 weeks (around 05/21/2022) for As scheduled.  Future Appointments  Date Time Provider [place]  05/16/2022  8:30 AM [facility]  05/16/2022  9:15 AM [facility] [place] [facility] None  05/16/2022 10:10 AM Janyth Pupa, DO [facility]    No orders of the defined types were placed in this encounter.  [place], [facility] 04/23/2022 11:51 AM

## 2022-04-23 NOTE — Patient Instructions (Signed)
Wanda Craig, thank you for choosing our office today! We appreciate the opportunity to meet your healthcare needs. You may receive a short survey by mail, e-mail, or through [employer]. If you are happy with your care we would appreciate if you could take just a few minutes to complete the survey questions. We read all of your comments and take your feedback very seriously. Thank you again for choosing our office.  Center for [employer] Team at [place] at [facility] ([place], [place] [address]) Entrance C, located off of [place] parking   You will have your sugar test next visit.  Please do not eat or drink anything after midnight the night before you come, not even water.  You will be here for at least two hours.  Please make an appointment online for the bloodwork at [email or web address] for 8:00am (or as close to this as possible). Make sure you select the [facility] service center.   CLASSES: Go to [email or web address] to register for classes (childbirth, breastfeeding, waterbirth, infant CPR, daddy bootcamp, etc.)  Call the office [phone]) or go to [facility] if: You begin to have strong, frequent contractions Your water breaks.  Sometimes it is a big gush of fluid, sometimes it is just a trickle that keeps getting your panties wet or running down your legs You have vaginal bleeding.  It is normal to have a small amount of spotting if your cervix was checked.  You don't feel your baby moving like normal.  If you don't, get you something to eat and drink and lay down and focus on feeling your baby move.   If your baby is still not moving like normal, you should call the office or go to [facility].  Call the office [phone]) or go to [facility] hospital for these signs of pre-eclampsia: Severe headache that does not go away with Tylenol Visual changes- seeing spots, double, blurred vision Pain under your right breast or upper  abdomen that does not go away with Tums or heartburn medicine Nausea and/or vomiting Severe swelling in your hands, feet, and face    [facility] Pediatricians/Family Doctors [facility] [place]): [address] [place], [place]: [address] [place], [facility]): [place], [phone] (call to ask if accepting patients) [facility] Department: [address] [place], [facility]): [address], [place] [facility]: [address] [place], [place] [facility] [place]: [place], [facility]): [phone] [facility]: [address]: [address], [facility]: [phone], [phone]  Home Blood Pressure Monitoring for Patients   Your provider has recommended that you check your blood pressure (BP) at least once a week at home. If you do not have a blood pressure cuff at home, one will be provided for you. Contact your provider if you have not received your monitor within 1 week.   Helpful Tips for Accurate Home Blood Pressure Checks  Don't smoke, exercise, or drink  caffeine 30 minutes before checking your BP Use the restroom before checking your BP (a full bladder can raise your pressure) Relax in a comfortable upright chair Feet on the ground Left arm resting comfortably on a flat surface at the level of your heart Legs uncrossed Back supported Sit quietly and don't talk Place the cuff on your bare arm Adjust snuggly, so that only two fingertips can fit between your skin and the top of the cuff Check 2  readings separated by at least one minute Keep a log of your BP readings For a visual, please reference this diagram: http://ccnc.care/bpdiagram  Provider Name: [facility]     Phone: [phone]  Zone 1: ALL CLEAR  Continue to monitor your symptoms:  BP reading is less than 140 (top number) or less than 90 (bottom number)  No right upper stomach pain No headaches or seeing spots No feeling nauseated or throwing up No swelling in face and hands  Zone 2: CAUTION Call your doctor's office for any of the following:  BP reading is greater than 140 (top number) or greater than 90 (bottom number)  Stomach pain under your ribs in the middle or right side Headaches or seeing spots Feeling nauseated or throwing up Swelling in face and hands  Zone 3: EMERGENCY  Seek immediate medical care if you have any of the following:  BP reading is greater than160 (top number) or greater than 110 (bottom number) Severe headaches not improving with Tylenol Serious difficulty catching your breath Any worsening symptoms from Zone 2   Second Trimester of Pregnancy The second trimester is from week 13 through week 28, months 4 through 6. The second trimester is often a time when you feel your best. Your body has also adjusted to being pregnant, and you begin to feel better physically. Usually, morning sickness has lessened or quit completely, you may have more energy, and you may have an increase in appetite. The second trimester is also a time when the fetus is growing rapidly. At the end of the sixth month, the fetus is about 9 inches long and weighs about 1 pounds. You will likely begin to feel the baby move (quickening) between 18 and 20 weeks of the pregnancy. BODY CHANGES Your body goes through many changes during pregnancy. The changes vary from woman to woman.  Your weight will continue to increase. You will notice your lower abdomen bulging out. You may begin to get stretch marks on your  hips, abdomen, and breasts. You may develop headaches that can be relieved by medicines approved by your health care provider. You may urinate more often because the fetus is pressing on your bladder. You may develop or continue to have heartburn as a result of your pregnancy. You may develop constipation because certain hormones are causing the muscles that push waste through your intestines to slow down. You may develop hemorrhoids or swollen, bulging veins (varicose veins). You may have back pain because of the weight gain and pregnancy hormones relaxing your joints between the bones in your pelvis and as a result of a shift in weight and the muscles that support your balance. Your breasts will continue to grow and be tender. Your gums may bleed and may be sensitive to brushing and flossing. Dark spots or blotches (chloasma, mask of pregnancy) may develop on your face. This will likely fade after the baby is born. A dark line from your belly button to the pubic area (linea nigra) may appear. This  will likely fade after the baby is born. You may have changes in your hair. These can include thickening of your hair, rapid growth, and changes in texture. Some women also have hair loss during or after pregnancy, or hair that feels dry or thin. Your hair will most likely return to normal after your baby is born. WHAT TO EXPECT AT YOUR PRENATAL VISITS During a routine prenatal visit: You will be weighed to make sure you and the fetus are growing normally. Your blood pressure will be taken. Your abdomen will be measured to track your baby's growth. The fetal heartbeat will be listened to. Any test results from the previous visit will be discussed. Your health care provider may ask you: How you are feeling. If you are feeling the baby move. If you have had any abnormal symptoms, such as leaking fluid, bleeding, severe headaches, or abdominal cramping. If you have any questions. Other tests that may  be performed during your second trimester include: Blood tests that check for: Low iron levels (anemia). Gestational diabetes (between 24 and 28 weeks). Rh antibodies. Urine tests to check for infections, diabetes, or protein in the urine. An ultrasound to confirm the proper growth and development of the baby. An amniocentesis to check for possible genetic problems. Fetal screens for spina bifida and Down syndrome. HOME CARE INSTRUCTIONS  Avoid all smoking, herbs, alcohol, and unprescribed drugs. These chemicals affect the formation and growth of the baby. Follow your health care provider's instructions regarding medicine use. There are medicines that are either safe or unsafe to take during pregnancy. Exercise only as directed by your health care provider. Experiencing uterine cramps is a good sign to stop exercising. Continue to eat regular, healthy meals. Wear a good support bra for breast tenderness. Do not use hot tubs, steam rooms, or saunas. Wear your seat belt at all times when driving. Avoid raw meat, uncooked cheese, cat litter boxes, and soil used by cats. These carry germs that can cause birth defects in the baby. Take your prenatal vitamins. Try taking a stool softener (if your health care provider approves) if you develop constipation. Eat more high-fiber foods, such as fresh vegetables or fruit and whole grains. Drink plenty of fluids to keep your urine clear or pale yellow. Take warm sitz baths to soothe any pain or discomfort caused by hemorrhoids. Use hemorrhoid cream if your health care provider approves. If you develop varicose veins, wear support hose. Elevate your feet for 15 minutes, 3-4 times a day. Limit salt in your diet. Avoid heavy lifting, wear low heel shoes, and practice good posture. Rest with your legs elevated if you have leg cramps or low back pain. Visit your dentist if you have not gone yet during your pregnancy. Use a soft toothbrush to brush your teeth  and be gentle when you floss. A sexual relationship may be continued unless your health care provider directs you otherwise. Continue to go to all your prenatal visits as directed by your health care provider. SEEK MEDICAL CARE IF:  You have dizziness. You have mild pelvic cramps, pelvic pressure, or nagging pain in the abdominal area. You have persistent nausea, vomiting, or diarrhea. You have a bad smelling vaginal discharge. You have pain with urination. SEEK IMMEDIATE MEDICAL CARE IF:  You have a fever. You are leaking fluid from your vagina. You have spotting or bleeding from your vagina. You have severe abdominal cramping or pain. You have rapid weight gain or loss. You have shortness of   breath with chest pain. You notice sudden or extreme swelling of your face, hands, ankles, feet, or legs. You have not felt your baby move in over an hour. You have severe headaches that do not go away with medicine. You have vision changes. Document Released: 01/08/2001 Document Revised: 01/19/2013 Document Reviewed: 03/17/2012 [facility] Patient Information 2015 [place], [place]. This information is not intended to replace advice given to you by your health care provider. Make sure you discuss any questions you have with your health care provider.

## 2022-04-25 ENCOUNTER — Encounter: Payer: Self-pay | Admitting: *Deleted

## 2022-04-29 ENCOUNTER — Ambulatory Visit (INDEPENDENT_AMBULATORY_CARE_PROVIDER_SITE_OTHER): Payer: BC Managed Care – PPO | Admitting: *Deleted

## 2022-04-29 VITALS — BP 130/70

## 2022-04-29 DIAGNOSIS — O4440 Low lying placenta NOS or without hemorrhage, unspecified trimester: Secondary | ICD-10-CM

## 2022-04-29 DIAGNOSIS — Z302 Encounter for sterilization: Secondary | ICD-10-CM

## 2022-04-29 DIAGNOSIS — Z0130 Encounter for examination of blood pressure without abnormal findings: Secondary | ICD-10-CM

## 2022-04-29 DIAGNOSIS — Z3480 Encounter for supervision of other normal pregnancy, unspecified trimester: Secondary | ICD-10-CM

## 2022-04-29 LAB — POCT URINALYSIS DIPSTICK OB
Blood, UA: NEGATIVE
Glucose, UA: NEGATIVE
Ketones, UA: NEGATIVE
Leukocytes, UA: NEGATIVE
Nitrite, UA: NEGATIVE
POC,PROTEIN,UA: NEGATIVE

## 2022-04-29 NOTE — Progress Notes (Signed)
   NURSE VISIT- BLOOD PRESSURE CHECK  SUBJECTIVE:  Wanda Craig is a 27 y.o. G83P1001 female here for BP check. She is [redacted]w[redacted]d pregnant    HYPERTENSION ROS:  Pregnant:  Severe headaches that don't go away with tylenol/other medicines: sometimes Visual changes (seeing spots/double/blurred vision) only after standing for long periods of time Severe pain under right breast breast or in center of upper chest No  Severe nausea/vomiting No  Taking medicines as instructed not applicable    OBJECTIVE:  BP 130/70   LMP 11/06/2021   Appearance alert, well appearing, and in no distress and oriented to person, place, and time.  ASSESSMENT: Pregnancy [redacted]w[redacted]d  blood pressure check  PLAN: Discussed with Knute Neu, CNM, [facility]   Recommendations: no changes needed, check bp at home after sitting for 10 minutes and send bp via mychart message Follow-up: as scheduled   Alice Rieger  04/29/2022 4:00 PM

## 2022-05-05 ENCOUNTER — Encounter: Payer: Self-pay | Admitting: Family Medicine

## 2022-05-16 ENCOUNTER — Encounter: Payer: Self-pay | Admitting: Obstetrics & Gynecology

## 2022-05-16 ENCOUNTER — Other Ambulatory Visit: Payer: BC Managed Care – PPO

## 2022-05-16 ENCOUNTER — Ambulatory Visit (INDEPENDENT_AMBULATORY_CARE_PROVIDER_SITE_OTHER): Payer: BC Managed Care – PPO

## 2022-05-16 ENCOUNTER — Ambulatory Visit (INDEPENDENT_AMBULATORY_CARE_PROVIDER_SITE_OTHER): Payer: BC Managed Care – PPO | Admitting: Obstetrics & Gynecology

## 2022-05-16 VITALS — BP 126/75 | HR 77 | Wt 214.0 lb

## 2022-05-16 DIAGNOSIS — Z3480 Encounter for supervision of other normal pregnancy, unspecified trimester: Secondary | ICD-10-CM

## 2022-05-16 DIAGNOSIS — O4442 Low lying placenta NOS or without hemorrhage, second trimester: Secondary | ICD-10-CM

## 2022-05-16 DIAGNOSIS — O4440 Low lying placenta NOS or without hemorrhage, unspecified trimester: Secondary | ICD-10-CM

## 2022-05-16 DIAGNOSIS — R7401 Elevation of levels of liver transaminase levels: Secondary | ICD-10-CM

## 2022-05-16 DIAGNOSIS — Z3A27 27 weeks gestation of pregnancy: Secondary | ICD-10-CM

## 2022-05-16 DIAGNOSIS — Z302 Encounter for sterilization: Secondary | ICD-10-CM

## 2022-05-16 NOTE — Progress Notes (Signed)
[place] 27+2 wks,cephalic,posterior placenta gr 0,edge of placenta to cx 2.5 cm,cx 4.6 cm,SVP of fluid 6.8 cm,FHR 150 bpm

## 2022-05-16 NOTE — Progress Notes (Addendum)
LOW-RISK PREGNANCY VISIT Patient name: Wanda Craig MRN [id number]  Date of birth: 03/28/1995 Chief Complaint:   Routine Prenatal Visit  History of Present Illness:   Wanda Craig is a 27 y.o. G55P1001 female at [redacted]w[redacted]d with an Estimated Date of Delivery: 08/13/22 being seen today for ongoing management of a low-risk pregnancy.   -O negative -Low-lying placenta     02/06/2022    2:28 PM 10/04/2020    9:43 AM 08/29/2020    3:07 PM  Depression screen PHQ 2/9  Decreased Interest 0 0 0  Down, Depressed, Hopeless 0 0 0  PHQ - 2 Score 0 0 0  Altered sleeping 0 0   Tired, decreased energy 0 1   Change in appetite 0 0   Feeling bad or failure about yourself  0 0   Trouble concentrating 0 0   Moving slowly or fidgety/restless 0 0   Suicidal thoughts 0 0   PHQ-9 Score 0 1     Today she reports no complaints. Contractions: Not present. Vag. Bleeding: None.  Movement: Present. denies leaking of fluid. Review of Systems:   Pertinent items are noted in HPI Denies abnormal vaginal discharge w/ itching/odor/irritation, headaches, visual changes, shortness of breath, chest pain, abdominal pain, severe nausea/vomiting, or problems with urination or bowel movements unless otherwise stated above. Pertinent History Reviewed:  Reviewed past medical,surgical, social, obstetrical and family history.  Reviewed problem list, medications and allergies.  Physical Assessment:   Vitals:   05/16/22 0941  BP: 126/75  Pulse: 77  Weight: 214 lb (97.1 kg)  Body mass index is 37.91 kg/m.        Physical Examination:   General appearance: Well appearing, and in no distress  Mental status: Alert, oriented to person, place, and time  Skin: Warm & dry  Respiratory: Normal respiratory effort, no distress  Abdomen: Soft, gravid, nontender  Pelvic: Cervical exam deferred         Extremities:    Psych:  mood and affect appropriate  Fetal Status:     Movement: Present  cephalic,posterior placenta gr 0,edge  of placenta to cx 2.5 cm,cx 4.6 cm,SVP of fluid 6.8 cm,FHR 150 bpm   Chaperone: n/a    No results found for this or any previous visit (from the past 24 hour(s)).   Assessment & Plan:  1) Low-risk pregnancy G2P1001 at [redacted]w[redacted]d with an Estimated Date of Delivery: 08/13/22   -Marginal placenta previa- now resolved.  Low lying placenta consider b/w 2-3cm from cervical os.  Will recheck again in 4 weeks -Discussed that at this time we would offer a vaginal delivery  -Rh-, blood work today  -Elevated ALT,  recheck today    Meds: No orders of the defined types were placed in this encounter.  Labs/procedures today: PN-2, OB ultrasound  Plan:  Continue routine obstetrical care  Next visit: prefers in person    Reviewed: Preterm labor symptoms and general obstetric precautions including but not limited to vaginal bleeding, contractions, leaking of fluid and fetal movement were reviewed in detail with the patient.  All questions were answered. Pt has home bp cuff. Check bp weekly, let us know if >140/90.   Follow-up: Return in about 4 weeks (around 06/13/2022) for LROB visit (with MD) and ultrasound .  Orders Placed This Encounter  Procedures   US OB Follow Up    Myna Hidalgo, DO Attending Obstetrician & Gynecologist, [facility] for [employer], [facility]

## 2022-05-17 ENCOUNTER — Encounter: Payer: Self-pay | Admitting: *Deleted

## 2022-05-17 LAB — CBC
Hematocrit: 33.4 % — ABNORMAL LOW (ref 34.0–46.6)
Hemoglobin: 10.9 g/dL — ABNORMAL LOW (ref 11.1–15.9)
MCH: 30.1 pg (ref 26.6–33.0)
MCHC: 32.6 g/dL (ref 31.5–35.7)
MCV: 92 fL (ref 79–97)
Platelets: 308 10*3/uL (ref 150–450)
RBC: 3.62 x10E6/uL — ABNORMAL LOW (ref 3.77–5.28)
RDW: 12 % (ref 11.7–15.4)
WBC: 8.2 10*3/uL (ref 3.4–10.8)

## 2022-05-17 LAB — HEPATIC FUNCTION PANEL
ALT: 11 IU/L (ref 0–32)
AST: 13 IU/L (ref 0–40)
Albumin: 3.5 g/dL — ABNORMAL LOW (ref 4.0–5.0)
Alkaline Phosphatase: 124 IU/L — ABNORMAL HIGH (ref 44–121)
Bilirubin Total: <0.2 mg/dL (ref 0.0–1.2)
Bilirubin, Direct: <0.1 mg/dL (ref 0.00–0.40)
Total Protein: 6.1 g/dL (ref 6.0–8.5)

## 2022-05-17 LAB — GLUCOSE TOLERANCE, 2 HOURS W/ 1HR
Glucose, 1 hour: 139 mg/dL (ref 70–179)
Glucose, 2 hour: 100 mg/dL (ref 70–152)
Glucose, Fasting: 96 mg/dL — ABNORMAL HIGH (ref 70–91)

## 2022-05-17 LAB — ANTIBODY SCREEN: Antibody Screen: NEGATIVE

## 2022-05-17 LAB — RPR: RPR Ser Ql: NONREACTIVE

## 2022-05-17 LAB — HIV ANTIBODY (ROUTINE TESTING W REFLEX): HIV Screen 4th Generation wRfx: NONREACTIVE

## 2022-05-21 ENCOUNTER — Ambulatory Visit: Payer: BC Managed Care – PPO

## 2022-05-22 ENCOUNTER — Ambulatory Visit (INDEPENDENT_AMBULATORY_CARE_PROVIDER_SITE_OTHER): Payer: BC Managed Care – PPO | Admitting: *Deleted

## 2022-05-22 VITALS — BP 135/82 | HR 84

## 2022-05-22 DIAGNOSIS — Z3A28 28 weeks gestation of pregnancy: Secondary | ICD-10-CM | POA: Diagnosis not present

## 2022-05-22 DIAGNOSIS — O26893 Other specified pregnancy related conditions, third trimester: Secondary | ICD-10-CM | POA: Diagnosis not present

## 2022-05-22 DIAGNOSIS — Z6791 Unspecified blood type, Rh negative: Secondary | ICD-10-CM

## 2022-05-22 NOTE — Progress Notes (Signed)
   NURSE VISIT- INJECTION  SUBJECTIVE:  Wanda Craig is a 27 y.o. G66P1001 female here for a Rhophylac for Rh neg status during pregnancy. She is [redacted]w[redacted]d pregnant.   OBJECTIVE:  BP 135/82 (BP Location: Left Arm, Patient Position: Sitting, Cuff Size: Normal)   Pulse 84   LMP 11/06/2021   Appears well, in no apparent distress  Injection administered in: Left upper quad. gluteus  No orders of the defined types were placed in this encounter.   ASSESSMENT: Pregnancy [redacted]w[redacted]d Rhophylac for Rh neg status during pregnancy PLAN: Follow-up: as scheduled   Annamarie Dawley  05/22/2022 3:32 PM

## 2022-05-30 ENCOUNTER — Encounter: Payer: Self-pay | Admitting: Obstetrics & Gynecology

## 2022-06-13 ENCOUNTER — Encounter: Payer: Self-pay | Admitting: Obstetrics & Gynecology

## 2022-06-13 ENCOUNTER — Ambulatory Visit (INDEPENDENT_AMBULATORY_CARE_PROVIDER_SITE_OTHER): Payer: BC Managed Care – PPO

## 2022-06-13 ENCOUNTER — Ambulatory Visit (INDEPENDENT_AMBULATORY_CARE_PROVIDER_SITE_OTHER): Payer: BC Managed Care – PPO | Admitting: Obstetrics & Gynecology

## 2022-06-13 VITALS — BP 120/71 | HR 87 | Wt 219.2 lb

## 2022-06-13 DIAGNOSIS — Z3A31 31 weeks gestation of pregnancy: Secondary | ICD-10-CM

## 2022-06-13 DIAGNOSIS — O26893 Other specified pregnancy related conditions, third trimester: Secondary | ICD-10-CM

## 2022-06-13 DIAGNOSIS — O4440 Low lying placenta NOS or without hemorrhage, unspecified trimester: Secondary | ICD-10-CM

## 2022-06-13 DIAGNOSIS — Z3480 Encounter for supervision of other normal pregnancy, unspecified trimester: Secondary | ICD-10-CM

## 2022-06-13 DIAGNOSIS — O4443 Low lying placenta NOS or without hemorrhage, third trimester: Secondary | ICD-10-CM

## 2022-06-13 DIAGNOSIS — Z6791 Unspecified blood type, Rh negative: Secondary | ICD-10-CM

## 2022-06-13 DIAGNOSIS — Z302 Encounter for sterilization: Secondary | ICD-10-CM

## 2022-06-13 NOTE — Progress Notes (Signed)
[place] 31+2 wks,cephalic,tip of placenta to cx 3.2 cm,posterior placenta gr 0,CX 4.2 cm,FHR 140 bpm,AFI 18 cm,EFW 1763 g 42%

## 2022-06-13 NOTE — Progress Notes (Signed)
   LOW-RISK PREGNANCY VISIT Patient name: Wanda Craig MRN [id number]  Date of birth: 1995/12/22 Chief Complaint:   Routine Prenatal Visit  History of Present Illness:   Wanda Craig is a 27 y.o. G79P1001 female at [redacted]w[redacted]d with an Estimated Date of Delivery: 08/13/22 being seen today for ongoing management of a low-risk pregnancy.   Borderline GDM- fasting 96 Sugars reviewed, all normal Pt continue checking every other day     02/06/2022    2:28 PM 10/04/2020    9:43 AM 08/29/2020    3:07 PM  Depression screen PHQ 2/9  Decreased Interest 0 0 0  Down, Depressed, Hopeless 0 0 0  PHQ - 2 Score 0 0 0  Altered sleeping 0 0   Tired, decreased energy 0 1   Change in appetite 0 0   Feeling bad or failure about yourself  0 0   Trouble concentrating 0 0   Moving slowly or fidgety/restless 0 0   Suicidal thoughts 0 0   PHQ-9 Score 0 1     Today she reports no complaints. Contractions: Not present. Vag. Bleeding: None.  Movement: Present. denies leaking of fluid. Review of Systems:   Pertinent items are noted in HPI Denies abnormal vaginal discharge w/ itching/odor/irritation, headaches, visual changes, shortness of breath, chest pain, abdominal pain, severe nausea/vomiting, or problems with urination or bowel movements unless otherwise stated above. Pertinent History Reviewed:  Reviewed past medical,surgical, social, obstetrical and family history.  Reviewed problem list, medications and allergies.  Physical Assessment:   Vitals:   06/13/22 1408  BP: 120/71  Pulse: 87  Weight: 219 lb 3.2 oz (99.4 kg)  Body mass index is 38.83 kg/m.        Physical Examination:   General appearance: Well appearing, and in no distress  Mental status: Alert, oriented to person, place, and time  Skin: Warm & dry  Respiratory: Normal respiratory effort, no distress  Abdomen: Soft, gravid, nontender  Pelvic: Cervical exam deferred         Extremities:    Psych:  mood and affect appropriate  Fetal  Status:     Movement: Present  cephalic,tip of placenta to cx 3.2 cm,posterior placenta gr 0,CX 4.2 cm,FHR 140 bpm,AFI 18 cm,EFW 1763 g 42%   Chaperone: n/a    No results found for this or any previous visit (from the past 24 hour(s)).   Assessment & Plan:  1) Low-risk pregnancy G2P1001 at [redacted]w[redacted]d with an Estimated Date of Delivery: 08/13/22   -Borderline GDM Sugars appropriate  -Rh neg, s/p RhoGAM  Continue routine OB care Meds: No orders of the defined types were placed in this encounter.  Labs/procedures today: growth scan  Plan:  Continue routine obstetrical care  Next visit: prefers in person    Reviewed: Preterm labor symptoms and general obstetric precautions including but not limited to vaginal bleeding, contractions, leaking of fluid and fetal movement were reviewed in detail with the patient.  All questions were answered. Pt has home bp cuff. Check bp weekly, let us know if >140/90.   Follow-up: Return in about 2 weeks (around 06/27/2022) for LROB visit (ok for CNM).  No orders of the defined types were placed in this encounter.   Myna Hidalgo, DO Attending Obstetrician & Gynecologist, [facility] for [employer], [facility]

## 2022-06-27 ENCOUNTER — Encounter: Payer: Self-pay | Admitting: Advanced Practice Midwife

## 2022-06-27 ENCOUNTER — Ambulatory Visit (INDEPENDENT_AMBULATORY_CARE_PROVIDER_SITE_OTHER): Payer: BC Managed Care – PPO | Admitting: Advanced Practice Midwife

## 2022-06-27 VITALS — BP 126/82 | HR 79 | Wt 220.0 lb

## 2022-06-27 DIAGNOSIS — Z3480 Encounter for supervision of other normal pregnancy, unspecified trimester: Secondary | ICD-10-CM

## 2022-06-27 DIAGNOSIS — Z3A33 33 weeks gestation of pregnancy: Secondary | ICD-10-CM

## 2022-06-27 DIAGNOSIS — O24410 Gestational diabetes mellitus in pregnancy, diet controlled: Secondary | ICD-10-CM

## 2022-06-27 NOTE — Patient Instructions (Signed)
Wanda Craig, I greatly value your feedback.  If you receive a survey following your visit with [place] today, we appreciate you taking the time to fill it out.  Thanks, Cathie Beams, DNP, CNM  [facility] HAS MOVED!!! It is now [facility] at [facility] ([address] [place], [place] [address]) Entrance located off of [address] Free 24/7 valet parking   Go to [email or web address] to register for FREE online childbirth classes    Call the office [phone]) or go to [facility] if: You begin to have strong, frequent contractions Your water breaks.  Sometimes it is a big gush of fluid, sometimes it is just a trickle that keeps getting your panties wet or running down your legs You have vaginal bleeding.  It is normal to have a small amount of spotting if your cervix was checked.  You don't feel your baby moving like normal.  If you don't, get you something to eat and drink and lay down and focus on feeling your baby move.  You should feel at least 10 movements in 2 hours.  If you don't, you should call the office or go to [facility].   Home Blood Pressure Monitoring for Patients   Your provider has recommended that you check your blood pressure (BP) at least once a week at home. If you do not have a blood pressure cuff at home, one will be provided for you. Contact your provider if you have not received your monitor within 1 week.   Helpful Tips for Accurate Home Blood Pressure Checks  Don't smoke, exercise, or drink caffeine 30 minutes before checking your BP Use the restroom before checking your BP (a full bladder can raise your pressure) Relax in a comfortable upright chair Feet on the ground Left arm resting comfortably on a flat surface at the level of your heart Legs uncrossed Back supported Sit quietly and don't talk Place the cuff on your bare arm Adjust snuggly, so that only two fingertips can fit between your skin and the top of  the cuff Check 2 readings separated by at least one minute Keep a log of your BP readings For a visual, please reference this diagram: http://ccnc.care/bpdiagram  Provider Name: [facility]     Phone: [phone]  Zone 1: ALL CLEAR  Continue to monitor your symptoms:  BP reading is less than 140 (top number) or less than 90 (bottom number)  No right upper stomach pain No headaches or seeing spots No feeling nauseated or throwing up No swelling in face and hands  Zone 2: CAUTION Call your doctor's office for any of the following:  BP reading is greater than 140 (top number) or greater than 90 (bottom number)  Stomach pain under your ribs in the middle or right side Headaches or seeing spots Feeling nauseated or throwing up Swelling in face and hands  Zone 3: EMERGENCY  Seek immediate medical care if you have any of the following:  BP reading is greater than160 (top number) or greater than 110 (bottom number) Severe headaches not improving with Tylenol Serious difficulty catching your breath Any worsening symptoms from Zone 2

## 2022-06-27 NOTE — Progress Notes (Signed)
HIGH-RISK PREGNANCY VISIT Patient name: Wanda Craig MRN [id number]  Date of birth: 03-20-1995 Chief Complaint:   Routine Prenatal Visit  History of Present Illness:   Wanda Craig is a 27 y.o. G54P1001 female at [redacted]w[redacted]d with an Estimated Date of Delivery: 08/13/22 being seen today for ongoing management of a high-risk pregnancy complicated by diabetes mellitus A1DM.  FBS was 96, pt advised that she could take BS QOD.  She documented QID BS for 4 days, all normal except 2 FBS of 97.  Advised pt that she should continue QID testing, bring log to every visit.   Today she reports no complaints. Contractions: Not present.  .  Movement: Present. denies leaking of fluid.      02/06/2022    2:28 PM 10/04/2020    9:43 AM 08/29/2020    3:07 PM  Depression screen PHQ 2/9  Decreased Interest 0 0 0  Down, Depressed, Hopeless 0 0 0  PHQ - 2 Score 0 0 0  Altered sleeping 0 0   Tired, decreased energy 0 1   Change in appetite 0 0   Feeling bad or failure about yourself  0 0   Trouble concentrating 0 0   Moving slowly or fidgety/restless 0 0   Suicidal thoughts 0 0   PHQ-9 Score 0 1         02/06/2022    2:29 PM 10/04/2020    9:43 AM  GAD 7 : Generalized Anxiety Score  Nervous, Anxious, on Edge 0 0  Control/stop worrying 0 0  Worry too much - different things 0 0  Trouble relaxing 0 0  Restless 0 0  Easily annoyed or irritable 0 0  Afraid - awful might happen 0 0  Total GAD 7 Score 0 0     Review of Systems:   Pertinent items are noted in HPI Denies abnormal vaginal discharge w/ itching/odor/irritation, headaches, visual changes, shortness of breath, chest pain, abdominal pain, severe nausea/vomiting, or problems with urination or bowel movements unless otherwise stated above. Pertinent History Reviewed:  Reviewed past medical,surgical, social, obstetrical and family history.  Reviewed problem list, medications and allergies. Physical Assessment:   Vitals:   06/27/22 1611  BP: 126/82   Pulse: 79  Weight: 220 lb (99.8 kg)  Body mass index is 38.97 kg/m.           Physical Examination:   General appearance: alert, well appearing, and in no distress  Mental status: alert, oriented to person, place, and time  Skin: warm & dry   Extremities: Edema: None    Cardiovascular: normal heart rate noted  Respiratory: normal respiratory effort, no distress  Abdomen: gravid, soft, non-tender  Pelvic: Cervical exam deferred         Fetal Status: Fetal Heart Rate (bpm): 140 Fundal Height: 34 cm Movement: Present    Fetal Surveillance Testing today: doppler   Chaperone: N/A    No results found for this or any previous visit (from the past 24 hour(s)).  Assessment & Plan:  High-risk pregnancy: G2P1001 at [redacted]w[redacted]d with an Estimated Date of Delivery: 08/13/22      ICD-10-CM   1. Supervision of other normal pregnancy, antepartum  Z34.80     2. [redacted] weeks gestation of pregnancy  Z3A.33     3. Diet controlled gestational diabetes mellitus (GDM) in third trimester  O24.410         Meds: No orders of the defined types were placed in this encounter.  Orders: No orders of the defined types were placed in this encounter.    Labs/procedures today: none    Reviewed: Preterm labor symptoms and general obstetric precautions including but not limited to vaginal bleeding, contractions, leaking of fluid and fetal movement were reviewed in detail with the patient.  All questions were answered. Does have home bp cuff. Office bp cuff given: not applicable. Check bp daily, let us know if consistently >140 and/or >90.  Follow-up: Return in about 2 weeks (around 07/11/2022) for HROB w/CNM or MD.   Future Appointments  Date Time Provider Department Center  07/11/2022 10:10 AM Cresenzo-Dishmon, Scarlette Calico, CNM [facility]    No orders of the defined types were placed in this encounter.  Jacklyn Shell  Attending Physician for the Center for [facility] 06/27/2022 10:37 PM

## 2022-07-08 ENCOUNTER — Ambulatory Visit (INDEPENDENT_AMBULATORY_CARE_PROVIDER_SITE_OTHER): Payer: BC Managed Care – PPO | Admitting: Obstetrics & Gynecology

## 2022-07-08 ENCOUNTER — Encounter: Payer: Self-pay | Admitting: Obstetrics & Gynecology

## 2022-07-08 VITALS — BP 119/76 | HR 89 | Wt 220.8 lb

## 2022-07-08 DIAGNOSIS — Z3483 Encounter for supervision of other normal pregnancy, third trimester: Secondary | ICD-10-CM

## 2022-07-08 NOTE — Progress Notes (Signed)
HIGH-RISK PREGNANCY VISIT Patient name: Wanda Craig MRN [id number]  Date of birth: 29-Sep-1995 Chief Complaint:   Routine Prenatal Visit  History of Present Illness:   Wanda Craig is a 27 y.o. G1P1001 female at [redacted]w[redacted]d with an Estimated Date of Delivery: 08/13/22 being seen today for ongoing management of a high-risk pregnancy complicated by:  -GDMA1 -O neg  Today she reports no complaints.   Contractions: Not present. Vag. Bleeding: None.  Movement: Present. denies leaking of fluid.      02/06/2022    2:28 PM 10/04/2020    9:43 AM 08/29/2020    3:07 PM  Depression screen PHQ 2/9  Decreased Interest 0 0 0  Down, Depressed, Hopeless 0 0 0  PHQ - 2 Score 0 0 0  Altered sleeping 0 0   Tired, decreased energy 0 1   Change in appetite 0 0   Feeling bad or failure about yourself  0 0   Trouble concentrating 0 0   Moving slowly or fidgety/restless 0 0   Suicidal thoughts 0 0   PHQ-9 Score 0 1      Current Outpatient Medications  Medication Instructions   aspirin 162 mg, Oral, Daily   Blood Pressure Monitor MISC For regular home bp monitoring during pregnancy   cetirizine (ZYRTEC) 10 mg, Oral, Daily   ferrous sulfate 325 mg, Oral, Every other day   Prenatal Vit-Fe Fumarate-FA (MULTIVITAMIN-PRENATAL) 27-0.8 MG TABS tablet 1 tablet, Oral, Daily     Review of Systems:   Pertinent items are noted in HPI Denies abnormal vaginal discharge w/ itching/odor/irritation, headaches, visual changes, shortness of breath, chest pain, abdominal pain, severe nausea/vomiting, or problems with urination or bowel movements unless otherwise stated above. Pertinent History Reviewed:  Reviewed past medical,surgical, social, obstetrical and family history.  Reviewed problem list, medications and allergies. Physical Assessment:   Vitals:   07/08/22 1130  BP: 119/76  Pulse: 89  Weight: 220 lb 12.8 oz (100.2 kg)  Body mass index is 39.11 kg/m.           Physical Examination:   General  appearance: alert, well appearing, and in no distress  Mental status: normal mood, behavior, speech, dress, motor activity, and thought processes  Skin: warm & dry   Extremities:      Cardiovascular: normal heart rate noted  Respiratory: normal respiratory effort, no distress  Abdomen: gravid, soft, non-tender  Pelvic: Cervical exam deferred         Fetal Status: Fetal Heart Rate (bpm): 150 Fundal Height: 35 cm Movement: Present    Fetal Surveillance Testing today: doppler   Chaperone: N/A    No results found for this or any previous visit (from the past 24 hour(s)).   Assessment & Plan:  High-risk pregnancy: G2P1001 at [redacted]w[redacted]d with an Estimated Date of Delivery: 08/13/22   1) GDMA1 Well controlled with diet Reviewed concerns about IOL, discussed the "why" Will review once closer to 39wks  2) O neg s/p RhoGAM  Meds: No orders of the defined types were placed in this encounter.   Labs/procedures today: none  Treatment Plan:  continue routine OB care, []  GBS, GC/C next visit  Reviewed: Preterm labor symptoms and general obstetric precautions including but not limited to vaginal bleeding, contractions, leaking of fluid and fetal movement were reviewed in detail with the patient.  All questions were answered. Pt has home bp cuff. Check bp weekly, let us know if >140/90.   Follow-up: Return in about 1 week (  around 07/15/2022) for HROB visit (ok for CNM).   Future Appointments  Date Time Provider Department Center  07/15/2022  3:50 PM Hermina Staggers, MD [facility]  07/22/2022  2:10 PM Jacklyn Shell, CNM [facility]  07/29/2022  2:10 PM Lazaro Arms, MD [facility]  08/05/2022  1:50 PM Cheral Marker, CNM [facility]  08/12/2022  1:50 PM Myna Hidalgo, DO [facility]    No orders of the defined types were placed in this encounter.   Myna Hidalgo, DO Attending Obstetrician & Gynecologist, [facility] for [employer], [facility]

## 2022-07-11 ENCOUNTER — Encounter: Payer: BC Managed Care – PPO | Admitting: Advanced Practice Midwife

## 2022-07-15 ENCOUNTER — Encounter: Payer: Self-pay | Admitting: Obstetrics and Gynecology

## 2022-07-15 ENCOUNTER — Ambulatory Visit (INDEPENDENT_AMBULATORY_CARE_PROVIDER_SITE_OTHER): Payer: BC Managed Care – PPO | Admitting: Obstetrics and Gynecology

## 2022-07-15 VITALS — BP 131/75 | HR 87 | Wt 221.8 lb

## 2022-07-15 DIAGNOSIS — Z302 Encounter for sterilization: Secondary | ICD-10-CM

## 2022-07-15 DIAGNOSIS — Z3480 Encounter for supervision of other normal pregnancy, unspecified trimester: Secondary | ICD-10-CM

## 2022-07-15 DIAGNOSIS — Z3A35 35 weeks gestation of pregnancy: Secondary | ICD-10-CM

## 2022-07-15 DIAGNOSIS — Z6791 Unspecified blood type, Rh negative: Secondary | ICD-10-CM

## 2022-07-15 DIAGNOSIS — O26893 Other specified pregnancy related conditions, third trimester: Secondary | ICD-10-CM

## 2022-07-15 DIAGNOSIS — Z8759 Personal history of other complications of pregnancy, childbirth and the puerperium: Secondary | ICD-10-CM

## 2022-07-15 NOTE — Progress Notes (Signed)
Subjective:  Wanda Craig is a 27 y.o. G2P1001 at [redacted]w[redacted]d being seen today for ongoing prenatal care.  She is currently monitored for the following issues for this high-risk pregnancy and has Rh negative state in antepartum period; Cholelithiases; History of gestational diabetes; History of gestational hypertension; Encounter for supervision of normal pregnancy, antepartum; Request for sterilization; and Diet controlled gestational diabetes mellitus (GDM) in third trimester on their problem list.  Patient reports  general discomforts of pregnancy .  Contractions: Irregular.  .  Movement: Present. Denies leaking of fluid.   The following portions of the patient's history were reviewed and updated as appropriate: allergies, current medications, past family history, past medical history, past social history, past surgical history and problem list. Problem list updated.  Objective:   Vitals:   07/15/22 1614  BP: 131/75  Pulse: 87  Weight: 221 lb 12.8 oz (100.6 kg)    Fetal Status:     Movement: Present     General:  Alert, oriented and cooperative. Patient is in no acute distress.  Skin: Skin is warm and dry. No rash noted.   Cardiovascular: Normal heart rate noted  Respiratory: Normal respiratory effort, no problems with respiration noted  Abdomen: Soft, gravid, appropriate for gestational age. Pain/Pressure: Absent     Pelvic:  Cervical exam deferred        Extremities: Normal range of motion.  Edema: None  Mental Status: Normal mood and affect. Normal behavior. Normal judgment and thought content.   Urinalysis:      Assessment and Plan:  Pregnancy: G2P1001 at [redacted]w[redacted]d  1. Supervision of other normal pregnancy, antepartum Stable GBS and vaginal cultures next visit  2. History of gestational hypertension BP stable No S/Sx at present   3. Rh negative state in antepartum period S/P Rhogam  4. Request for sterilization Private insurance  5. Gestational DM CBG's in goal  range Continue with diet  Preterm labor symptoms and general obstetric precautions including but not limited to vaginal bleeding, contractions, leaking of fluid and fetal movement were reviewed in detail with the patient. Please refer to After Visit Summary for other counseling recommendations.  Return in about 1 week (around 07/22/2022) for OB visit, face to face, any provider.   Hermina Staggers, MD

## 2022-07-22 ENCOUNTER — Encounter: Payer: Self-pay | Admitting: Advanced Practice Midwife

## 2022-07-22 ENCOUNTER — Other Ambulatory Visit (HOSPITAL_COMMUNITY)
Admission: RE | Admit: 2022-07-22 | Discharge: 2022-07-22 | Disposition: A | Discharge status: Home / Self Care | Payer: BC Managed Care – PPO | Source: Ambulatory Visit | Attending: Advanced Practice Midwife | Admitting: Advanced Practice Midwife

## 2022-07-22 ENCOUNTER — Ambulatory Visit (INDEPENDENT_AMBULATORY_CARE_PROVIDER_SITE_OTHER): Payer: BC Managed Care – PPO | Admitting: Advanced Practice Midwife

## 2022-07-22 VITALS — BP 115/77 | HR 78 | Wt 221.0 lb

## 2022-07-22 DIAGNOSIS — Z3483 Encounter for supervision of other normal pregnancy, third trimester: Secondary | ICD-10-CM | POA: Insufficient documentation

## 2022-07-22 DIAGNOSIS — Z3480 Encounter for supervision of other normal pregnancy, unspecified trimester: Secondary | ICD-10-CM

## 2022-07-22 DIAGNOSIS — Z3A36 36 weeks gestation of pregnancy: Secondary | ICD-10-CM | POA: Diagnosis present

## 2022-07-22 DIAGNOSIS — O24410 Gestational diabetes mellitus in pregnancy, diet controlled: Secondary | ICD-10-CM

## 2022-07-22 NOTE — Patient Instructions (Signed)

## 2022-07-22 NOTE — Progress Notes (Signed)
   LOW-RISK PREGNANCY VISIT Patient name: Wanda Craig MRN [id number]  Date of birth: 12-27-1995 Chief Complaint:   Routine Prenatal Visit (culture)  History of Present Illness:   Wanda Craig is a 27 y.o. G76P1001 female at [redacted]w[redacted]d with an Estimated Date of Delivery: 08/13/22 being seen today for ongoing management of a low-risk pregnancy.  Today she reports no complaints. Contractions: Irregular.  .  Movement: Present. denies leaking of fluid.  FBS all <95  and 2 hr pp BS all < 120 Review of Systems:   Pertinent items are noted in HPI Denies abnormal vaginal discharge w/ itching/odor/irritation, headaches, visual changes, shortness of breath, chest pain, abdominal pain, severe nausea/vomiting, or problems with urination or bowel movements unless otherwise stated above. Pertinent History Reviewed:  Reviewed past medical,surgical, social, obstetrical and family history.  Reviewed problem list, medications and allergies. Physical Assessment:   Vitals:   07/22/22 1416  BP: 115/77  Pulse: 78  Weight: 221 lb (100.2 kg)  Body mass index is 39.15 kg/m.        Physical Examination:   General appearance: Well appearing, and in no distress  Mental status: Alert, oriented to person, place, and time  Skin: Warm & dry  Cardiovascular: Normal heart rate noted  Respiratory: Normal respiratory effort, no distress  Abdomen: Soft, gravid, nontender  Pelvic: Cervical exam performed  Dilation: 1 Effacement (%): 20 Station: Ballotable  Extremities: Edema: None  Fetal Status: Fetal Heart Rate (bpm): 145 Fundal Height: 38 cm Movement: Present Presentation: Vertex  Chaperone:  Peggy Dones    No results found for this or any previous visit (from the past 24 hour(s)).  Assessment & Plan:    Pregnancy: G2P1001 at [redacted]w[redacted]d 1. Supervision of other normal pregnancy, antepartum   2. [redacted] weeks gestation of pregnancy  - Culture, beta strep (group b only) - Cervicovaginal ancillary only( [place])  3.  Diet controlled gestational diabetes mellitus (GDM) in third trimester Great control Schedule EFW Plan IOL 39-40 weeks    Meds: No orders of the defined types were placed in this encounter.  Labs/procedures today: see above  Plan:  Continue routine obstetrical care  Next visit: prefers in person    Reviewed: Term labor symptoms and general obstetric precautions including but not limited to vaginal bleeding, contractions, leaking of fluid and fetal movement were reviewed in detail with the patient.  All questions were answered. Has home bp cuff.. Check bp weekly, let us know if >140/90.   Follow-up: Return for needs EFW before 08/06/22.  Future Appointments  Date Time Provider Department Center  07/29/2022  9:15 AM [facility] [place] [facility] None  07/29/2022  2:10 PM Lazaro Arms, MD [facility]  08/05/2022  1:50 PM Cheral Marker, CNM [facility]  08/12/2022  1:50 PM Myna Hidalgo, DO [facility]    Orders Placed This Encounter  Procedures   Culture, beta strep (group b only)   US OB Follow Up   Jacklyn Shell DNP, CNM 07/22/2022 3:21 PM

## 2022-07-24 LAB — CERVICOVAGINAL ANCILLARY ONLY
Chlamydia: NEGATIVE
Comment: NEGATIVE
Comment: NORMAL
Neisseria Gonorrhea: NEGATIVE

## 2022-07-25 LAB — CULTURE, BETA STREP (GROUP B ONLY): Strep Gp B Culture: NEGATIVE

## 2022-07-29 ENCOUNTER — Ambulatory Visit (INDEPENDENT_AMBULATORY_CARE_PROVIDER_SITE_OTHER): Payer: BC Managed Care – PPO

## 2022-07-29 ENCOUNTER — Ambulatory Visit (INDEPENDENT_AMBULATORY_CARE_PROVIDER_SITE_OTHER): Payer: BC Managed Care – PPO | Admitting: Obstetrics & Gynecology

## 2022-07-29 VITALS — BP 130/82 | HR 91 | Wt 225.0 lb

## 2022-07-29 DIAGNOSIS — O24410 Gestational diabetes mellitus in pregnancy, diet controlled: Secondary | ICD-10-CM

## 2022-07-29 DIAGNOSIS — Z3480 Encounter for supervision of other normal pregnancy, unspecified trimester: Secondary | ICD-10-CM

## 2022-07-29 DIAGNOSIS — Z3A37 37 weeks gestation of pregnancy: Secondary | ICD-10-CM | POA: Diagnosis not present

## 2022-07-29 DIAGNOSIS — Z302 Encounter for sterilization: Secondary | ICD-10-CM

## 2022-07-29 NOTE — Progress Notes (Signed)
[place] 37+6 wks,cephalic,posterior placenta gr 3,AFI 17 cm,FHR 138 bpm,EFW 3546 g 79%

## 2022-07-29 NOTE — Progress Notes (Signed)
HIGH-RISK PREGNANCY VISIT Patient name: Wanda Craig MRN [id number]  Date of birth: 14-Mar-1995 Chief Complaint:   Routine Prenatal Visit  History of Present Illness:   Wanda Craig is a 27 y.o. G17P1001 female at [redacted]w[redacted]d with an Estimated Date of Delivery: 08/13/22 being seen today for ongoing management of a high-risk pregnancy complicated by A1DM with EFW 79%(4000 grams-->40 weeks).    Today she reports no complaints. Contractions: Irregular. Vag. Bleeding: None.  Movement: Present. denies leaking of fluid.      02/06/2022    2:28 PM 10/04/2020    9:43 AM 08/29/2020    3:07 PM  Depression screen PHQ 2/9  Decreased Interest 0 0 0  Down, Depressed, Hopeless 0 0 0  PHQ - 2 Score 0 0 0  Altered sleeping 0 0   Tired, decreased energy 0 1   Change in appetite 0 0   Feeling bad or failure about yourself  0 0   Trouble concentrating 0 0   Moving slowly or fidgety/restless 0 0   Suicidal thoughts 0 0   PHQ-9 Score 0 1         02/06/2022    2:29 PM 10/04/2020    9:43 AM  GAD 7 : Generalized Anxiety Score  Nervous, Anxious, on Edge 0 0  Control/stop worrying 0 0  Worry too much - different things 0 0  Trouble relaxing 0 0  Restless 0 0  Easily annoyed or irritable 0 0  Afraid - awful might happen 0 0  Total GAD 7 Score 0 0     Review of Systems:   Pertinent items are noted in HPI Denies abnormal vaginal discharge w/ itching/odor/irritation, headaches, visual changes, shortness of breath, chest pain, abdominal pain, severe nausea/vomiting, or problems with urination or bowel movements unless otherwise stated above. Pertinent History Reviewed:  Reviewed past medical,surgical, social, obstetrical and family history.  Reviewed problem list, medications and allergies. Physical Assessment:   Vitals:   07/29/22 1116  BP: 130/82  Pulse: 91  Weight: 225 lb (102.1 kg)  Body mass index is 39.86 kg/m.           Physical Examination:   General appearance: alert, well appearing, and  in no distress  Mental status: alert, oriented to person, place, and time  Skin: warm & dry   Extremities: Edema: Trace    Cardiovascular: normal heart rate noted  Respiratory: normal respiratory effort, no distress  Abdomen: gravid, soft, non-tender  Pelvic: Cervical exam deferred         Fetal Status:     Movement: Present    Fetal Surveillance Testing today: EFW 79%   Chaperone:     No results found for this or any previous visit (from the past 24 hour(s)).  Assessment & Plan:  High-risk pregnancy: G2P1001 at [redacted]w[redacted]d with an Estimated Date of Delivery: 08/13/22      ICD-10-CM   1. Supervision of other normal pregnancy, antepartum  Z34.80     2. Diet controlled gestational diabetes mellitus (GDM) in third trimester  O24.410         Meds: No orders of the defined types were placed in this encounter.   Orders: No orders of the defined types were placed in this encounter.    Labs/procedures today: U/S  Treatment Plan:  IOL 40 weeks    Follow-up: Return in about 1 week (around 08/05/2022).   Future Appointments  Date Time Provider Department Center  08/05/2022  1:50 PM  Cheral Marker, [place] [facility]  08/12/2022  1:50 PM Myna Hidalgo, DO [facility]    No orders of the defined types were placed in this encounter.  Lazaro Arms  Attending Physician for the Center for [facility] 07/29/2022 11:55 AM

## 2022-08-05 ENCOUNTER — Ambulatory Visit (INDEPENDENT_AMBULATORY_CARE_PROVIDER_SITE_OTHER): Payer: BC Managed Care – PPO | Admitting: Obstetrics & Gynecology

## 2022-08-05 ENCOUNTER — Telehealth (HOSPITAL_COMMUNITY): Payer: Self-pay | Admitting: *Deleted

## 2022-08-05 ENCOUNTER — Encounter (HOSPITAL_COMMUNITY): Payer: Self-pay | Admitting: *Deleted

## 2022-08-05 ENCOUNTER — Encounter: Payer: BC Managed Care – PPO | Admitting: Women's Health

## 2022-08-05 ENCOUNTER — Encounter: Payer: Self-pay | Admitting: Obstetrics & Gynecology

## 2022-08-05 VITALS — BP 129/87 | HR 87 | Wt 227.4 lb

## 2022-08-05 DIAGNOSIS — Z6791 Unspecified blood type, Rh negative: Secondary | ICD-10-CM

## 2022-08-05 DIAGNOSIS — O26899 Other specified pregnancy related conditions, unspecified trimester: Secondary | ICD-10-CM

## 2022-08-05 DIAGNOSIS — O26893 Other specified pregnancy related conditions, third trimester: Secondary | ICD-10-CM

## 2022-08-05 DIAGNOSIS — Z3480 Encounter for supervision of other normal pregnancy, unspecified trimester: Secondary | ICD-10-CM

## 2022-08-05 DIAGNOSIS — Z3A38 38 weeks gestation of pregnancy: Secondary | ICD-10-CM

## 2022-08-05 DIAGNOSIS — O0993 Supervision of high risk pregnancy, unspecified, third trimester: Secondary | ICD-10-CM

## 2022-08-05 DIAGNOSIS — O24410 Gestational diabetes mellitus in pregnancy, diet controlled: Secondary | ICD-10-CM

## 2022-08-05 NOTE — Progress Notes (Signed)
HIGH-RISK PREGNANCY VISIT Patient name: KARINNA HANSARD MRN [id number]  Date of birth: 1995/09/24 Chief Complaint:   Routine Prenatal Visit (Cervical check and member striping)  History of Present Illness:   MERRIEL KENDRICK is a 27 y.o. G8P1001 female at [redacted]w[redacted]d with an Estimated Date of Delivery: 08/13/22 being seen today for ongoing management of a high-risk pregnancy complicated by:  -GDMA1- controlled  Today she reports no complaints.   Contractions: Irregular.  Denies vaginal bleeding .  Movement: Present. denies leaking of fluid.      02/06/2022    2:28 PM 10/04/2020    9:43 AM 08/29/2020    3:07 PM  Depression screen PHQ 2/9  Decreased Interest 0 0 0  Down, Depressed, Hopeless 0 0 0  PHQ - 2 Score 0 0 0  Altered sleeping 0 0   Tired, decreased energy 0 1   Change in appetite 0 0   Feeling bad or failure about yourself  0 0   Trouble concentrating 0 0   Moving slowly or fidgety/restless 0 0   Suicidal thoughts 0 0   PHQ-9 Score 0 1      Current Outpatient Medications  Medication Instructions   aspirin 162 mg, Oral, Daily   Blood Pressure Monitor MISC For regular home bp monitoring during pregnancy   cetirizine (ZYRTEC) 10 mg, Oral, Daily   ferrous sulfate 325 mg, Oral, Every other day   Prenatal Vit-Fe Fumarate-FA (MULTIVITAMIN-PRENATAL) 27-0.8 MG TABS tablet 1 tablet, Oral, Daily     Review of Systems:   Pertinent items are noted in HPI Denies abnormal vaginal discharge w/ itching/odor/irritation, headaches, visual changes, shortness of breath, chest pain, abdominal pain, severe nausea/vomiting, or problems with urination or bowel movements unless otherwise stated above. Pertinent History Reviewed:  Reviewed past medical,surgical, social, obstetrical and family history.  Reviewed problem list, medications and allergies. Physical Assessment:   Vitals:   08/05/22 1359  BP: 129/87  Pulse: 87  Weight: 227 lb 6.4 oz (103.1 kg)  Body mass index is 40.28 kg/m.            Physical Examination:   General appearance: alert, well appearing, and in no distress  Mental status: normal mood, behavior, speech, dress, motor activity, and thought processes  Skin: warm & dry   Extremities: Edema: Trace    Cardiovascular: normal heart rate noted  Respiratory: normal respiratory effort, no distress  Abdomen: gravid, soft, non-tender  Pelvic: Cervical exam performed  Dilation: Fingertip Effacement (%): 20 Station: -3  Fetal Status: Fetal Heart Rate (bpm): 145 Fundal Height: 38 cm Movement: Present    Fetal Surveillance Testing today: Doppler  Chaperone:  Dr. Ronnie Doss     No results found for this or any previous visit (from the past 24 hour(s)).   Assessment & Plan:  High-risk pregnancy: G2P1001 at [redacted]w[redacted]d with an Estimated Date of Delivery: 08/13/22   1) GDMA1 Scheduled for induction later this week   -Oneg, s/p RhoGAM  Meds: No orders of the defined types were placed in this encounter.   Labs/procedures today: none  Treatment Plan:  continue routine OB care, IOL scheduled  Reviewed: Term labor symptoms and general obstetric precautions including but not limited to vaginal bleeding, contractions, leaking of fluid and fetal movement were reviewed in detail with the patient.  All questions were answered. Pt has home bp cuff. Check bp weekly, let us know if >140/90.   Follow-up: Return in about 1 week (around 08/12/2022) for HROB visit.  Future Appointments  Date Time Provider Department Center  08/07/2022  6:30 AM [facility] SCHED ROOM [facility] None    Myna Hidalgo, DO Attending Obstetrician & Gynecologist, [facility] for [facility], [facility]

## 2022-08-05 NOTE — Telephone Encounter (Signed)
Preadmission screen  

## 2022-08-07 ENCOUNTER — Inpatient Hospital Stay (HOSPITAL_COMMUNITY): Payer: BC Managed Care – PPO

## 2022-08-07 ENCOUNTER — Inpatient Hospital Stay (HOSPITAL_COMMUNITY): Payer: BC Managed Care – PPO | Admitting: Anesthesiology

## 2022-08-07 ENCOUNTER — Inpatient Hospital Stay (HOSPITAL_COMMUNITY)
Admission: RE | Admit: 2022-08-07 | Discharge: 2022-08-09 | DRG: 807 | Disposition: A | Discharge status: Home / Self Care | Payer: BC Managed Care – PPO | Attending: Obstetrics and Gynecology | Admitting: Obstetrics and Gynecology

## 2022-08-07 ENCOUNTER — Encounter (HOSPITAL_COMMUNITY): Payer: Self-pay | Admitting: Obstetrics & Gynecology

## 2022-08-07 ENCOUNTER — Other Ambulatory Visit: Payer: Self-pay

## 2022-08-07 DIAGNOSIS — Z3480 Encounter for supervision of other normal pregnancy, unspecified trimester: Principal | ICD-10-CM

## 2022-08-07 DIAGNOSIS — O26893 Other specified pregnancy related conditions, third trimester: Secondary | ICD-10-CM | POA: Diagnosis present

## 2022-08-07 DIAGNOSIS — Z3A39 39 weeks gestation of pregnancy: Secondary | ICD-10-CM | POA: Diagnosis not present

## 2022-08-07 DIAGNOSIS — O24420 Gestational diabetes mellitus in childbirth, diet controlled: Secondary | ICD-10-CM | POA: Diagnosis present

## 2022-08-07 DIAGNOSIS — O24410 Gestational diabetes mellitus in pregnancy, diet controlled: Secondary | ICD-10-CM | POA: Diagnosis present

## 2022-08-07 DIAGNOSIS — Z23 Encounter for immunization: Secondary | ICD-10-CM

## 2022-08-07 DIAGNOSIS — O139 Gestational [pregnancy-induced] hypertension without significant proteinuria, unspecified trimester: Secondary | ICD-10-CM | POA: Diagnosis present

## 2022-08-07 DIAGNOSIS — O26899 Other specified pregnancy related conditions, unspecified trimester: Secondary | ICD-10-CM

## 2022-08-07 DIAGNOSIS — O134 Gestational [pregnancy-induced] hypertension without significant proteinuria, complicating childbirth: Secondary | ICD-10-CM | POA: Diagnosis not present

## 2022-08-07 DIAGNOSIS — Z6791 Unspecified blood type, Rh negative: Secondary | ICD-10-CM | POA: Diagnosis not present

## 2022-08-07 DIAGNOSIS — O99214 Obesity complicating childbirth: Secondary | ICD-10-CM | POA: Diagnosis present

## 2022-08-07 LAB — CBC
HCT: 34.2 % — ABNORMAL LOW (ref 36.0–46.0)
HCT: 33.6 % — ABNORMAL LOW (ref 36.0–46.0)
Hemoglobin: 11.2 g/dL — ABNORMAL LOW (ref 12.0–15.0)
Hemoglobin: 11.2 g/dL — ABNORMAL LOW (ref 12.0–15.0)
MCH: 28.2 pg (ref 26.0–34.0)
MCH: 28.9 pg (ref 26.0–34.0)
MCHC: 32.7 g/dL (ref 30.0–36.0)
MCHC: 33.3 g/dL (ref 30.0–36.0)
MCV: 86.1 fL (ref 80.0–100.0)
MCV: 86.8 fL (ref 80.0–100.0)
Platelets: 246 10*3/uL (ref 150–400)
Platelets: 216 10*3/uL (ref 150–400)
RBC: 3.97 MIL/uL (ref 3.87–5.11)
RBC: 3.87 MIL/uL (ref 3.87–5.11)
RDW: 13 % (ref 11.5–15.5)
RDW: 13.1 % (ref 11.5–15.5)
WBC: 9.9 10*3/uL (ref 4.0–10.5)
WBC: 8 10*3/uL (ref 4.0–10.5)
nRBC: 0 % (ref 0.0–0.2)
nRBC: 0 % (ref 0.0–0.2)

## 2022-08-07 LAB — COMPREHENSIVE METABOLIC PANEL
ALT: 15 U/L (ref 0–44)
AST: 18 U/L (ref 15–41)
Albumin: 2.6 g/dL — ABNORMAL LOW (ref 3.5–5.0)
Alkaline Phosphatase: 165 U/L — ABNORMAL HIGH (ref 38–126)
Anion gap: 15 (ref 5–15)
BUN: 8 mg/dL (ref 6–20)
CO2: 20 mmol/L — ABNORMAL LOW (ref 22–32)
Calcium: 8.9 mg/dL (ref 8.9–10.3)
Chloride: 101 mmol/L (ref 98–111)
Creatinine, Ser: 0.6 mg/dL (ref 0.44–1.00)
GFR, Estimated: >60 mL/min (ref >60)
Glucose, Bld: 91 mg/dL (ref 70–99)
Potassium: 3.8 mmol/L (ref 3.5–5.1)
Sodium: 136 mmol/L (ref 135–145)
Total Bilirubin: 0.1 mg/dL — ABNORMAL LOW (ref 0.3–1.2)
Total Protein: 6.3 g/dL — ABNORMAL LOW (ref 6.5–8.1)

## 2022-08-07 LAB — RPR: RPR Ser Ql: NONREACTIVE

## 2022-08-07 LAB — GLUCOSE, CAPILLARY
Glucose-Capillary: 90 mg/dL (ref 70–99)
Glucose-Capillary: 126 mg/dL — ABNORMAL HIGH (ref 70–99)
Glucose-Capillary: 88 mg/dL (ref 70–99)
Glucose-Capillary: 86 mg/dL (ref 70–99)

## 2022-08-07 LAB — TYPE AND SCREEN
ABO/RH(D): O NEG
Antibody Screen: NEGATIVE

## 2022-08-07 MED ORDER — OXYTOCIN BOLUS FROM INFUSION
333.0000 mL | Freq: Once | INTRAVENOUS | Status: AC
  Administered 2022-08-08: 333 mL via INTRAVENOUS

## 2022-08-07 MED ORDER — SOD CITRATE-CITRIC ACID 500-334 MG/5ML PO SOLN: 30.0000 mL | ORAL | Status: DC | PRN

## 2022-08-07 MED ORDER — EPHEDRINE 5 MG/ML INJ: 10.0000 mg | INTRAVENOUS | Status: DC | PRN

## 2022-08-07 MED ORDER — OXYTOCIN-SODIUM CHLORIDE 30-0.9 UT/500ML-% IV SOLN
1.0000 m[IU]/min | INTRAVENOUS | Status: DC
  Administered 2022-08-07: 2 m[IU]/min via INTRAVENOUS

## 2022-08-07 MED ORDER — LACTATED RINGERS IV SOLN: 500.0000 mL | Freq: Once | INTRAVENOUS | Status: DC

## 2022-08-07 MED ORDER — TERBUTALINE SULFATE 1 MG/ML IJ SOLN: 0.2500 mg | Freq: Once | INTRAMUSCULAR | Status: DC | PRN

## 2022-08-07 MED ORDER — OXYTOCIN-SODIUM CHLORIDE 30-0.9 UT/500ML-% IV SOLN: 2.5000 [IU]/h | INTRAVENOUS | Status: DC

## 2022-08-07 MED ORDER — FENTANYL-BUPIVACAINE-NACL 0.5-0.125-0.9 MG/250ML-% EP SOLN
12.0000 mL/h | EPIDURAL | Status: DC | PRN
  Administered 2022-08-07: 12 mL/h via EPIDURAL
  Filled 2022-08-07: qty 250

## 2022-08-07 MED ORDER — MISOPROSTOL 25 MCG QUARTER TABLET: 25.0000 ug | ORAL_TABLET | ORAL | Status: DC | PRN

## 2022-08-07 MED ORDER — PHENYLEPHRINE 80 MCG/ML (10ML) SYRINGE FOR IV PUSH (FOR BLOOD PRESSURE SUPPORT)
80.0000 ug | PREFILLED_SYRINGE | INTRAVENOUS | Status: DC | PRN
  Administered 2022-08-08: 80 ug via INTRAVENOUS

## 2022-08-07 MED ORDER — PHENYLEPHRINE 80 MCG/ML (10ML) SYRINGE FOR IV PUSH (FOR BLOOD PRESSURE SUPPORT)
80.0000 ug | PREFILLED_SYRINGE | INTRAVENOUS | Status: DC | PRN
  Filled 2022-08-07: qty 10

## 2022-08-07 MED ORDER — LACTATED RINGERS IV SOLN: INTRAVENOUS | Status: DC

## 2022-08-07 MED ORDER — DIPHENHYDRAMINE HCL 50 MG/ML IJ SOLN: 12.5000 mg | INTRAMUSCULAR | Status: DC | PRN

## 2022-08-07 MED ORDER — LIDOCAINE HCL (PF) 1 % IJ SOLN
INTRAMUSCULAR | Status: DC | PRN
  Administered 2022-08-07 (×2): 5 mL via EPIDURAL

## 2022-08-07 MED ORDER — LACTATED RINGERS IV SOLN: 500.0000 mL | INTRAVENOUS | Status: DC | PRN

## 2022-08-07 MED ORDER — OXYCODONE-ACETAMINOPHEN 5-325 MG PO TABS: 1.0000 | ORAL_TABLET | ORAL | Status: DC | PRN

## 2022-08-07 MED ORDER — LIDOCAINE HCL (PF) 1 % IJ SOLN: 30.0000 mL | INTRAMUSCULAR | Status: DC | PRN

## 2022-08-07 MED ORDER — MISOPROSTOL 50MCG HALF TABLET
50.0000 ug | ORAL_TABLET | Freq: Once | ORAL | Status: AC
  Administered 2022-08-07: 50 ug via ORAL
  Filled 2022-08-07: qty 1

## 2022-08-07 MED ORDER — MISOPROSTOL 25 MCG QUARTER TABLET
25.0000 ug | ORAL_TABLET | Freq: Once | ORAL | Status: AC
  Administered 2022-08-07: 25 ug via VAGINAL
  Filled 2022-08-07: qty 1

## 2022-08-07 MED ORDER — ONDANSETRON HCL 4 MG/2ML IJ SOLN
4.0000 mg | Freq: Four times a day (QID) | INTRAMUSCULAR | Status: DC | PRN
  Administered 2022-08-08: 4 mg via INTRAVENOUS
  Filled 2022-08-07: qty 2

## 2022-08-07 MED ORDER — ACETAMINOPHEN 325 MG PO TABS: 650.0000 mg | ORAL_TABLET | ORAL | Status: DC | PRN

## 2022-08-07 MED ORDER — OXYCODONE-ACETAMINOPHEN 5-325 MG PO TABS: 2.0000 | ORAL_TABLET | ORAL | Status: DC | PRN

## 2022-08-07 MED ORDER — OXYTOCIN-SODIUM CHLORIDE 30-0.9 UT/500ML-% IV SOLN
1.0000 m[IU]/min | INTRAVENOUS | Status: DC
  Filled 2022-08-07: qty 500

## 2022-08-07 NOTE — Anesthesia Procedure Notes (Signed)
Epidural Patient location during procedure: OB Start time: 08/07/2022 8:26 PM End time: 08/07/2022 8:34 PM  Staffing Anesthesiologist: Mal Amabile, MD Performed: anesthesiologist   Preanesthetic Checklist Completed: patient identified, IV checked, site marked, risks and benefits discussed, surgical consent, monitors and equipment checked, pre-op evaluation and timeout performed  Epidural Patient position: sitting Prep: DuraPrep and site prepped and draped Patient monitoring: continuous pulse ox and blood pressure Approach: midline Location: L3-L4 Injection technique: LOR air  Needle:  Needle type: Tuohy  Needle gauge: 17 G Needle length: 9 cm and 9 Needle insertion depth: 6 cm Catheter type: closed end flexible Catheter size: 19 Gauge Catheter at skin depth: 11 cm Test dose: negative and Other  Assessment Events: blood not aspirated, no cerebrospinal fluid, injection not painful, no injection resistance, no paresthesia and negative IV test  Additional Notes Patient identified. Risks and benefits discussed including failed block, incomplete  Pain control, post dural puncture headache, nerve damage, paralysis, blood pressure Changes, nausea, vomiting, reactions to medications-both toxic and allergic and post Partum back pain. All questions were answered. Patient expressed understanding and wished to proceed. Sterile technique was used throughout procedure. Epidural site was Dressed with sterile barrier dressing. No paresthesias, signs of intravascular injection Or signs of intrathecal spread were encountered.  Patient was more comfortable after the epidural was dosed. Please see RN's note for documentation of vital signs and FHR which are stable. Reason for block:procedure for pain

## 2022-08-07 NOTE — Anesthesia Preprocedure Evaluation (Signed)
Anesthesia Evaluation  Patient identified by MRN, date of birth, ID band Patient awake    Reviewed: Allergy & Precautions, Patient's Chart, lab work & pertinent test results  Airway Mallampati: II       Dental no notable dental hx.    Pulmonary neg pulmonary ROS   Pulmonary exam normal breath sounds clear to auscultation       Cardiovascular hypertension, Normal cardiovascular exam Rhythm:Regular Rate:Normal     Neuro/Psych negative neurological ROS  negative psych ROS   GI/Hepatic Neg liver ROS,GERD  ,,  Endo/Other  diabetes, Gestational  Morbid obesity  Renal/GU Renal diseasenegative Renal ROS  negative genitourinary   Musculoskeletal negative musculoskeletal ROS (+)    Abdominal  (+) + obese  Peds  Hematology  (+) Blood dyscrasia, anemia   Anesthesia Other Findings   Reproductive/Obstetrics (+) Pregnancy                              Anesthesia Physical Anesthesia Plan  ASA: 2  Anesthesia Plan: Epidural   Post-op Pain Management:    Induction: Intravenous  PONV Risk Score and Plan: Treatment may vary due to age or medical condition  Airway Management Planned: Natural Airway  Additional Equipment:   Intra-op Plan:   Post-operative Plan:   Informed Consent: I have reviewed the patients History and Physical, chart, labs and discussed the procedure including the risks, benefits and alternatives for the proposed anesthesia with the patient or authorized representative who has indicated his/her understanding and acceptance.     Dental advisory given  Plan Discussed with: Anesthesiologist  Anesthesia Plan Comments:          Anesthesia Quick Evaluation

## 2022-08-07 NOTE — Progress Notes (Signed)
Patient ID: Wanda Craig, female   DOB: 10/31/1995, 27 y.o.   MRN: [id number]  Comfortable w epidural; on R side w peanut  BPs 122/65, 125/60, 123/56 FHR 120s, +accels, some early variables Ctx q 2-4 mins with Pit at 37mu/min Cx 4/80/vtx -2-3 per RN exam at 2300  CBGs: 86, 88, 126, 90  IUP@39 .1wks gHTN GDMA1 IOL process  -Plan to check cx 4h from last exam unless change in symptoms  Arabella Merles CNM 08/07/2022

## 2022-08-07 NOTE — Progress Notes (Signed)
Wanda Craig is a 27 y.o. G2P1001 at [redacted]w[redacted]d by LMP admitted for induction of labor due to Gestational diabetes diet controlled.   Subjective: Patient doing well. Reports more "uncomfortable" contractions. FOB at bedside and supportive.   Objective: BP (!) 149/81   Pulse 95   Temp 98.7 F (37.1 C) (Oral)   Resp 16   Ht 5\' 2"  (1.575 m)   Wt 103.9 kg   LMP 11/06/2021   BMI 41.88 kg/m  No intake/output data recorded. No intake/output data recorded.  FHT:  FHR: 140 bpm, variability: moderate,  accelerations:  Present,  decelerations:  Absent UC:   irregular, every 3-7 minutes SVE:   Dilation: 2 Effacement (%): 50 Station: -2 Exam by:: Shay, CNM  Labs: Lab Results  Component Value Date   WBC 9.9 08/07/2022   HGB 11.2 (L) 08/07/2022   HCT 34.2 (L) 08/07/2022   MCV 86.1 08/07/2022   PLT 246 08/07/2022   Patient Vitals for the past 24 hrs:  BP Temp Temp src Pulse Resp Height Weight  08/07/22 1359 (!) 149/81 -- -- 95 16 -- --  08/07/22 1241 (!) 145/89 98.7 F (37.1 C) Oral 75 16 -- --  08/07/22 1139 (!) 140/76 -- -- 71 -- -- --  08/07/22 0934 128/81 -- -- 80 16 -- --  08/07/22 0923 (!) 152/100 -- -- 91 -- -- --  08/07/22 0804 138/82 -- -- 76 16 -- --  08/07/22 0705 126/80 -- -- 86 -- -- --  08/07/22 0700 -- 98.2 F (36.8 C) Oral -- 16 -- --  08/07/22 0604 -- -- -- -- -- 5\' 2"  (1.575 m) 103.9 kg   CBG (last 3)  Recent Labs    08/07/22 0734 08/07/22 1255  GLUCAP 90 126*     Assessment / Plan: Induction of labor due to gestational diabetes,  s/p dual Cytotec.    Labor: progressing well. Start pit 2x2 and continue to titrate up as needed Preeclampsia:  labs stable, continue to monitor bp Fetal Wellbeing:  Category I- continuous monitoring on pit  Pain Control:  Labor support without medications at this time. Planning for epidural. Patient may have epidural upon request  I/D:   GBS negative Anticipated MOD:  NSVD  Claudette Head, CNM 08/07/2022, 2:03 PM

## 2022-08-07 NOTE — Progress Notes (Signed)
Patient ID: Wanda Craig, female   DOB: Oct 25, 1995, 27 y.o.   MRN: [id number]  Pit running since 1300; no s/s pre-e; mild cramping; mild range BPs elevations over 4h apart today  BPs 144/83, 117/66, 149/81, 152/100 FHR 135-145, +accels, no decels Ctx q 2-3 mins with Pit @ 104mu/min Cx post 3/50/vtx -3; AROM for clear fluid  IUP@39 .1wks New onset gHTN GDMA1 Favorable cervix  -Continue to keep ctx reg with Pitocin, and now with ROM to help get into active labor -CMET and urine P/C ratio (from foley, if gets epidural placed, now that membranes are ruptured)  Arabella Merles CNM 08/07/2022 7:03 PM

## 2022-08-07 NOTE — H&P (Signed)
Wanda Craig is a 27 y.o. female presenting for IOL A1GDM. OB History     Gravida  2   Para  1   Term  1   Preterm      AB      Living  1      SAB      IAB      Ectopic      Multiple  0   Live Births  1          Past Medical History:  Diagnosis Date   Gall stones    Gestational diabetes    Pregnancy induced hypertension    Past Surgical History:  Procedure Laterality Date   NO PAST SURGERIES     Family History: family history includes Arthritis in her mother; Breast cancer in her maternal aunt; Dementia in her maternal grandmother; Diabetes in her paternal uncle; Mental illness in her paternal grandmother; Schizophrenia in her paternal grandmother. Social History:  reports that she has never smoked. She has never used smokeless tobacco. She reports that she does not currently use alcohol. She reports that she does not use drugs.    FAMILY TREE   RESULTS  Language English Pap 11/01/20 neg  Initiated care at 28wks GC/CT Initial:  -/-          36wks:-/-  Dating by LMP c/w 7wk [place]      Support person   Genetics NT/IT: neg/neg    AFP:      Panorama: LR female  BP cuff rx'd Carrier Screen neg      [place]/Hgb Elec    Rhogam        TDaP vaccine declined Blood Type O/Negative/-- (01/10 1617)  Flu vaccine   Antibody Negative (01/10 1617)  Covid vaccine   HBsAg Negative (01/10 1617)      RPR Non Reactive (01/10 1617)  Anatomy US Female, isolated LVEICF Rubella  8.75 (01/10 1617)  Feeding Plan breast HIV Non Reactive (01/10 1617)  Contraception vasectomy Hep C neg  Circumcision N/a      Pediatrician [facility]- [place] A1C/GTT Early: 5.4     26-28wks:  Prenatal Classes discussed          GBS   [ ]  PCN allergy  BTL Consent        VBAC Consent N/a PHQ9 & GAD7  [ ] New OB  [ ] 28wks   [ ] 36wks  Waterbirth [ ] Class [ ]  36wkCNM visit/consent         EFW 79%tile 3546g @ 37 weeks.   Review of Systems History Dilation: 1 Effacement (%): Thick Station: Ballotable Exam by::  Wanda Greathouse, RN Blood pressure 128/81, pulse 80, temperature 98.2 F (36.8 C), temperature source Oral, resp. rate 16, height 5\' 2"  (1.575 m), weight 103.9 kg, last menstrual period 11/06/2021, not currently breastfeeding. Maternal Exam:  Uterine Assessment: Contraction strength is mild.  Contraction frequency is irregular.  Abdomen: Patient reports no abdominal tenderness. Fetal presentation: vertex   Physical Exam Vitals and nursing note reviewed.  Constitutional:      General: She is not in acute distress.    Appearance: Normal appearance.  HENT:     Head: Normocephalic.  Pulmonary:     Effort: Pulmonary effort is normal.  Musculoskeletal:     Cervical back: Normal range of motion.  Skin:    General: Skin is warm and dry.  Neurological:     Mental Status: She is alert and oriented to person, place, and time.  Psychiatric:        Mood and Affect: Mood normal.    CBG (last 3)  Recent Labs    08/07/22 0734  GLUCAP 90    Prenatal labs: ABO, Rh: --/--/O NEG (07/10 1610) Antibody: NEG (07/10 9604) Rubella: 8.75 (01/10 1617) RPR: Non Reactive (04/18 0845)  HBsAg: Negative (01/10 1617)  HIV: Non Reactive (04/18 0845)  GBS: Negative/-- (06/24 1400)   Assessment/Plan: Wanda Craig , a  27 y.o. G2P1001 at 106w1d presents to L&D for IOL A1GDM   Labor: Initiate dual cytotec 50 oral 25 vag.  FWB: Cat I at this time.  I/D: GBS negative  A1GDM: Assess CBG q4hours in latent labor q2hours in active.  MOF: Breast  MOD: NVSD  MOC: Phexxi.       Wanda Craig) Wanda Portela, MSN, CNM  Center for [facility]  08/07/2022 10:06 AM

## 2022-08-08 ENCOUNTER — Encounter (HOSPITAL_COMMUNITY): Payer: Self-pay | Admitting: Obstetrics & Gynecology

## 2022-08-08 DIAGNOSIS — O134 Gestational [pregnancy-induced] hypertension without significant proteinuria, complicating childbirth: Secondary | ICD-10-CM

## 2022-08-08 DIAGNOSIS — Z3A39 39 weeks gestation of pregnancy: Secondary | ICD-10-CM

## 2022-08-08 LAB — PROTEIN / CREATININE RATIO, URINE
Creatinine, Urine: 119 mg/dL
Protein Creatinine Ratio: 0.19 mg/mg{Cre} — ABNORMAL HIGH (ref 0.00–0.15)
Total Protein, Urine: 23 mg/dL

## 2022-08-08 LAB — CBC
HCT: 33.6 % — ABNORMAL LOW (ref 36.0–46.0)
Hemoglobin: 11.1 g/dL — ABNORMAL LOW (ref 12.0–15.0)
MCH: 29.1 pg (ref 26.0–34.0)
MCHC: 33 g/dL (ref 30.0–36.0)
MCV: 88 fL (ref 80.0–100.0)
Platelets: 214 10*3/uL (ref 150–400)
RBC: 3.82 MIL/uL — ABNORMAL LOW (ref 3.87–5.11)
RDW: 13.1 % (ref 11.5–15.5)
WBC: 15.2 10*3/uL — ABNORMAL HIGH (ref 4.0–10.5)
nRBC: 0 % (ref 0.0–0.2)

## 2022-08-08 LAB — GLUCOSE, CAPILLARY: Glucose-Capillary: 183 mg/dL — ABNORMAL HIGH (ref 70–99)

## 2022-08-08 MED ORDER — IBUPROFEN 600 MG PO TABS
600.0000 mg | ORAL_TABLET | Freq: Four times a day (QID) | ORAL | Status: DC
  Administered 2022-08-08 – 2022-08-09 (×5): 600 mg via ORAL
  Filled 2022-08-08 (×5): qty 1

## 2022-08-08 MED ORDER — ONDANSETRON HCL 4 MG PO TABS: 4.0000 mg | ORAL_TABLET | ORAL | Status: DC | PRN

## 2022-08-08 MED ORDER — SIMETHICONE 80 MG PO CHEW: 80.0000 mg | CHEWABLE_TABLET | ORAL | Status: DC | PRN

## 2022-08-08 MED ORDER — WITCH HAZEL-GLYCERIN EX PADS: 1.0000 | MEDICATED_PAD | CUTANEOUS | Status: DC | PRN

## 2022-08-08 MED ORDER — DIPHENHYDRAMINE HCL 25 MG PO CAPS: 25.0000 mg | ORAL_CAPSULE | Freq: Four times a day (QID) | ORAL | Status: DC | PRN

## 2022-08-08 MED ORDER — RHO D IMMUNE GLOBULIN 1500 UNIT/2ML IJ SOSY
300.0000 ug | PREFILLED_SYRINGE | Freq: Once | INTRAMUSCULAR | Status: AC
  Administered 2022-08-08: 300 ug via INTRAVENOUS
  Filled 2022-08-08: qty 2

## 2022-08-08 MED ORDER — BENZOCAINE-MENTHOL 20-0.5 % EX AERO
1.0000 | INHALATION_SPRAY | CUTANEOUS | Status: DC | PRN
  Administered 2022-08-08: 1 via TOPICAL
  Filled 2022-08-08: qty 56

## 2022-08-08 MED ORDER — OXYCODONE HCL 5 MG PO TABS: 5.0000 mg | ORAL_TABLET | ORAL | Status: DC | PRN

## 2022-08-08 MED ORDER — TETANUS-DIPHTH-ACELL PERTUSSIS 5-2.5-18.5 LF-MCG/0.5 IM SUSY: 0.5000 mL | PREFILLED_SYRINGE | Freq: Once | INTRAMUSCULAR | Status: DC

## 2022-08-08 MED ORDER — FUROSEMIDE 20 MG PO TABS
20.0000 mg | ORAL_TABLET | Freq: Every day | ORAL | Status: DC
  Administered 2022-08-08 – 2022-08-09 (×2): 20 mg via ORAL
  Filled 2022-08-08 (×2): qty 1

## 2022-08-08 MED ORDER — SENNOSIDES-DOCUSATE SODIUM 8.6-50 MG PO TABS
2.0000 | ORAL_TABLET | ORAL | Status: DC
  Administered 2022-08-08: 2 via ORAL
  Filled 2022-08-08 (×2): qty 2

## 2022-08-08 MED ORDER — COCONUT OIL OIL
1.0000 | TOPICAL_OIL | Status: DC | PRN
  Administered 2022-08-08: 1 via TOPICAL

## 2022-08-08 MED ORDER — DIBUCAINE (PERIANAL) 1 % EX OINT: 1.0000 | TOPICAL_OINTMENT | CUTANEOUS | Status: DC | PRN

## 2022-08-08 MED ORDER — ACETAMINOPHEN 325 MG PO TABS: 650.0000 mg | ORAL_TABLET | ORAL | Status: DC | PRN

## 2022-08-08 MED ORDER — MEASLES, MUMPS & RUBELLA VAC IJ SOLR: 0.5000 mL | Freq: Once | INTRAMUSCULAR | Status: DC

## 2022-08-08 MED ORDER — PRENATAL MULTIVITAMIN CH
1.0000 | ORAL_TABLET | Freq: Every day | ORAL | Status: DC
  Administered 2022-08-08 – 2022-08-09 (×2): 1 via ORAL
  Filled 2022-08-08 (×2): qty 1

## 2022-08-08 MED ORDER — ONDANSETRON HCL 4 MG/2ML IJ SOLN: 4.0000 mg | INTRAMUSCULAR | Status: DC | PRN

## 2022-08-08 MED ORDER — ZOLPIDEM TARTRATE 5 MG PO TABS: 5.0000 mg | ORAL_TABLET | Freq: Every evening | ORAL | Status: DC | PRN

## 2022-08-08 NOTE — Lactation Note (Signed)
This note was copied from a baby's chart. Lactation Consultation Note  Patient Name: Girl Jasmyn Picha ZOXWR'U Date: 08/08/2022 Age:28 hours Reason for consult: Initial assessment Mom told LC that she is just to going to formula feed. LC discussed breast engorgement and how to dry up milk.  Maternal Data    Feeding    LATCH Score                    Lactation Tools Discussed/Used    Interventions Interventions: [facility] Services brochure  Discharge    Consult Status Consult Status: Complete    Dennette Faulconer G 08/08/2022, 8:51 PM

## 2022-08-08 NOTE — Anesthesia Postprocedure Evaluation (Signed)
Anesthesia Post Note  Patient: Wanda Craig  Procedure(s) Performed: AN AD HOC LABOR EPIDURAL     Patient location during evaluation: Mother Baby Anesthesia Type: Epidural Level of consciousness: awake and alert and oriented Pain management: satisfactory to patient Vital Signs Assessment: post-procedure vital signs reviewed and stable Respiratory status: respiratory function stable Cardiovascular status: stable Postop Assessment: no headache, no backache, epidural receding, patient able to bend at knees, no signs of nausea or vomiting, adequate PO intake and able to ambulate Anesthetic complications: no   No notable events documented.  Last Vitals:  Vitals:   08/08/22 0416 08/08/22 0526  BP: 123/77 119/71  Pulse: 65 68  Resp: 17 18  Temp: 36.5 C 36.6 C  SpO2: 98% 97%    Last Pain:  Vitals:   08/08/22 0526  TempSrc: Oral  PainSc: 0-No pain   Pain Goal:                   Javaria Knapke

## 2022-08-08 NOTE — Lactation Note (Signed)
This note was copied from a baby's chart. Lactation Consultation Note  Patient Name: Girl Leisl Spurrier ZOXWR'U Date: 08/08/2022 Age:28 hours  RN notified LC that mom doesn't want to be seen by Lactation tonight, wants to wait until am.   Maternal Data    Feeding Nipple Type: Extra Slow Flow  LATCH Score                    Lactation Tools Discussed/Used    Interventions    Discharge    Consult Status      Charyl Dancer 08/08/2022, 4:59 AM

## 2022-08-08 NOTE — Lactation Note (Signed)
This note was copied from a baby's chart. Lactation Consultation Note  Patient Name: Wanda Craig ZOXWR'U Date: 08/08/2022 Age:28 hours   Attempt made x 2.  Mother in bathroom and second attempt, photographs were being taken.  Lactation to follow up later.    Dahlia Byes [facility] 08/08/2022, 11:55 AM

## 2022-08-08 NOTE — Discharge Summary (Signed)
Postpartum Discharge Summary  Date of Service updated-7/12     Patient Name: Wanda Craig DOB: [birth year] MRN: [id number]  Date of admission: 08/07/2022 Delivery date:08/08/2022 Delivering provider: Cam Hai D Date of discharge: 08/09/2022  Admitting diagnosis: Gestational diabetes [O24.419] Intrauterine pregnancy: [redacted]w[redacted]d     Secondary diagnosis:  Active Problems:   Rh negative state in antepartum period   Gestational hypertension   Diet controlled gestational diabetes mellitus (GDM) in third trimester  Additional problems: none    Discharge diagnosis: Term Pregnancy Delivered, Gestational Hypertension, and GDM A1                                              Post partum procedures: none Augmentation: AROM, Pitocin, and Cytotec Complications: None  Hospital course: Induction of Labor With Vaginal Delivery   27 y.o. yo G2P2002 at [redacted]w[redacted]d was admitted to the hospital 08/07/2022 for induction of labor.  Indication for induction: A1 DM, and then was dx with gHTN intrapartum for mild range BPs without symptoms and neg pre-e labs.  Patient had an uncomplicated labor course. Membrane Rupture Time/Date: 6:54 PM,08/07/2022  Delivery Method:Vaginal, Spontaneous Episiotomy: None Lacerations:  None Details of delivery can be found in separate delivery note.  Patient had a postpartum course complicated by receiving Lasix 20mg  x 5 days; her PPD#1 fasting CBG was 183 and will plan to follow up as outpatient. Patient is discharged home 08/09/22.  Newborn Data: Birth date:08/08/2022 Birth time:2:22 AM Gender:Female Living status:Living Apgars:8 ,9  Weight:3350 g (7lb 6.2oz)  Magnesium Sulfate received: No BMZ received: No Rhophylac:Yes  MMR:N/A T-DaP: declined prenatally Flu: No Transfusion:No  Physical exam  Vitals:   08/08/22 1323 08/08/22 1700 08/08/22 2021 08/09/22 0533  BP: 110/62 107/61 122/81 (!) 99/55  Pulse: 72 69 70 75  Resp: 18 18 20 20   Temp: 97.9 F (36.6 C)  97.8 F (36.6 C) 97.9 F (36.6 C) 97.8 F (36.6 C)  TempSrc: Oral Oral Oral Oral  SpO2: 98% 99% 100% 98%  Weight:      Height:       General: alert, cooperative, and no distress Lochia: appropriate Uterine Fundus: firm Incision: N/A DVT Evaluation: No evidence of DVT seen on physical exam. Labs: Lab Results  Component Value Date   WBC 15.2 (H) 08/08/2022   HGB 11.1 (L) 08/08/2022   HCT 33.6 (L) 08/08/2022   MCV 88.0 08/08/2022   PLT 214 08/08/2022      Latest Ref Rng & Units 08/07/2022    6:38 AM  CMP  Glucose 70 - 99 mg/dL 91   BUN 6 - 20 mg/dL 8   Creatinine 3.24 - 4.01 mg/dL 0.27   Sodium 253 - 664 mmol/L 136   Potassium 3.5 - 5.1 mmol/L 3.8   Chloride 98 - 111 mmol/L 101   CO2 22 - 32 mmol/L 20   Calcium 8.9 - 10.3 mg/dL 8.9   Total Protein 6.5 - 8.1 g/dL 6.3   Total Bilirubin 0.3 - 1.2 mg/dL 0.1   Alkaline Phos 38 - 126 U/L 165   AST 15 - 41 U/L 18   ALT 0 - 44 U/L 15    Edinburgh Score:    08/08/2022   12:14 PM  Edinburgh Postnatal Depression Scale Screening Tool  I have been able to laugh and see the funny side of things. 0  I have looked forward with enjoyment to things. 0  I have blamed myself unnecessarily when things went wrong. 0  I have been anxious or worried for no good reason. 0  I have felt scared or panicky for no good reason. 0  Things have been getting on top of me. 1  I have been so unhappy that I have had difficulty sleeping. 0  I have felt sad or miserable. 0  I have been so unhappy that I have been crying. 0  The thought of harming myself has occurred to me. 0  Edinburgh Postnatal Depression Scale Total 1     After visit meds:  Allergies as of 08/09/2022   No Known Allergies      Medication List     STOP taking these medications    aspirin 81 MG chewable tablet       TAKE these medications    acetaminophen 325 MG tablet Commonly known as: Tylenol Take 2 tablets (650 mg total) by mouth every 4 (four) hours as needed  (for pain scale < 4).   Blood Pressure Monitor Misc For regular home bp monitoring during pregnancy   cetirizine 10 MG tablet Commonly known as: ZYRTEC Take 10 mg by mouth daily.   ferrous sulfate 325 (65 FE) MG EC tablet Take 325 mg by mouth every other day.   ibuprofen 600 MG tablet Commonly known as: ADVIL Take 1 tablet (600 mg total) by mouth every 6 (six) hours.   multivitamin-prenatal 27-0.8 MG Tabs tablet Take 1 tablet by mouth daily at 12 noon.         Discharge home in stable condition Infant Feeding: Breast Infant Disposition:home with mother Discharge instruction: per After Visit Summary and Postpartum booklet. Activity: Advance as tolerated. Pelvic rest for 6 weeks.  Diet: routine diet Future Appointments: Future Appointments  Date Time Provider Department Center  08/15/2022 11:30 AM [facility] NURSE [facility]  09/19/2022  8:30 AM [facility] LAB [facility]  09/19/2022  8:50 AM Cresenzo-Dishmon, Scarlette Calico, CNM [facility]   Follow up Visit:  Follow-up Information     [facility] Follow up.   Specialty: Obstetrics and Gynecology Why: Follow up in 1 wk for a blood pressure check and then your 5-6wk postpartum visit Contact information: [address] [place] [address] [phone]                Arabella Merles, CNM  Myrle Sheng R Please schedule this patient for Postpartum visit in: 6 weeks with the following provider: Any provider In-Person For C/S patients schedule nurse incision check in weeks 2 weeks: no High risk pregnancy complicated by: GDMA1 Delivery mode:  SVD Anticipated Birth Control:  Phexxi PP Procedures needed: RN BP check in 1wk; 2 hour GTT w 6wk visit Schedule Integrated BH visit: no   08/09/2022 Sharon Seller, DO

## 2022-08-09 ENCOUNTER — Other Ambulatory Visit (HOSPITAL_COMMUNITY): Payer: Self-pay

## 2022-08-09 LAB — RH IG WORKUP (INCLUDES ABO/RH)
Fetal Screen: NEGATIVE
Gestational Age(Wks): 39
Unit division: 0

## 2022-08-09 LAB — BIRTH TISSUE RECOVERY COLLECTION (PLACENTA DONATION)

## 2022-08-09 MED ORDER — IBUPROFEN 600 MG PO TABS: 600.0000 mg | ORAL_TABLET | Freq: Four times a day (QID) | ORAL | 0 refills | Status: DC

## 2022-08-09 MED ORDER — FUROSEMIDE 20 MG PO TABS
20.0000 mg | ORAL_TABLET | Freq: Every day | ORAL | 0 refills | Status: DC
  Filled 2022-08-09: qty 3, 3d supply, fill #0

## 2022-08-09 MED ORDER — ACETAMINOPHEN 325 MG PO TABS: 650.0000 mg | ORAL_TABLET | ORAL | Status: DC | PRN

## 2022-08-10 ENCOUNTER — Other Ambulatory Visit (HOSPITAL_COMMUNITY): Payer: Self-pay

## 2022-08-10 ENCOUNTER — Other Ambulatory Visit: Payer: Self-pay | Admitting: Obstetrics & Gynecology

## 2022-08-10 DIAGNOSIS — Z30018 Encounter for initial prescription of other contraceptives: Secondary | ICD-10-CM

## 2022-08-10 MED ORDER — PHEXXI 1.8-1-0.4 % VA GEL
5.0000 g | VAGINAL | 11 refills | Status: DC | PRN
Start: 2022-08-10 — End: 2022-09-19
  Filled 2022-08-10: qty 5, fill #0

## 2022-08-10 NOTE — Progress Notes (Signed)
Prescription for phexxi

## 2022-08-12 ENCOUNTER — Encounter: Payer: BC Managed Care – PPO | Admitting: Obstetrics & Gynecology

## 2022-08-15 ENCOUNTER — Telehealth: Payer: BC Managed Care – PPO

## 2022-08-16 ENCOUNTER — Encounter: Payer: Self-pay | Admitting: *Deleted

## 2022-08-16 ENCOUNTER — Telehealth (INDEPENDENT_AMBULATORY_CARE_PROVIDER_SITE_OTHER): Payer: BC Managed Care – PPO | Admitting: *Deleted

## 2022-08-16 VITALS — BP 112/83 | HR 74 | Ht 63.0 in

## 2022-08-16 DIAGNOSIS — Z0130 Encounter for examination of blood pressure without abnormal findings: Secondary | ICD-10-CM

## 2022-08-16 NOTE — Progress Notes (Signed)
   NURSE VISIT- BLOOD PRESSURE CHECK  I connected with [name] on 08/16/2022 by telephone  and verified that I am speaking with the correct person using two identifiers.   I discussed the limitations of evaluation and management by telemedicine. The patient expressed understanding and agreed to proceed.  Nurse is at the office, and patient is at home.  SUBJECTIVE:  Wanda Craig is a 27 y.o. G50P2002 female here for BP check. She is postpartum, delivery date 08/08/22.     HYPERTENSION ROS:  Pregnant/postpartum:  Severe headaches that don't go away with tylenol/other medicines: No  Visual changes (seeing spots/double/blurred vision) No  Severe pain under right breast breast or in center of upper chest No  Severe nausea/vomiting No  Taking medicines as instructed not applicable    OBJECTIVE:  BP 112/83 (BP Location: Left Arm, Patient Position: Sitting, Cuff Size: Normal)   Pulse 74   Ht 5\' 3"  (1.6 m)   LMP 11/06/2021   Breastfeeding Yes   BMI 40.57 kg/m   Appearance alert, well appearing, and in no distress.  ASSESSMENT: Postpartum  blood pressure check  PLAN: Discussed with Dr. Despina Hidden   Recommendations: no changes needed   Follow-up: as scheduled   Malachy Mood  08/16/2022 12:28 PM

## 2022-09-18 ENCOUNTER — Encounter: Payer: Self-pay | Admitting: Advanced Practice Midwife

## 2022-09-19 ENCOUNTER — Other Ambulatory Visit: Payer: BC Managed Care – PPO

## 2022-09-19 ENCOUNTER — Encounter: Payer: Self-pay | Admitting: Advanced Practice Midwife

## 2022-09-19 ENCOUNTER — Ambulatory Visit (INDEPENDENT_AMBULATORY_CARE_PROVIDER_SITE_OTHER): Payer: BC Managed Care – PPO | Admitting: Advanced Practice Midwife

## 2022-09-19 DIAGNOSIS — Z131 Encounter for screening for diabetes mellitus: Secondary | ICD-10-CM

## 2022-09-19 DIAGNOSIS — Z30018 Encounter for initial prescription of other contraceptives: Secondary | ICD-10-CM | POA: Diagnosis not present

## 2022-09-19 DIAGNOSIS — O24410 Gestational diabetes mellitus in pregnancy, diet controlled: Secondary | ICD-10-CM

## 2022-09-19 MED ORDER — PHEXXI 1.8-1-0.4 % VA GEL: 5.0000 g | VAGINAL | 11 refills | Status: DC | PRN

## 2022-09-19 NOTE — Progress Notes (Addendum)
Post Partum Visit Note   Chief Complaint:   Postpartum Care  History of Present Illness:   Wanda Craig is a 27 y.o. G73P2002 Caucasian female being seen today for a postpartum visit. She is 6 weeks postpartum following a spontaneous vaginal delivery at 39.1 gestational weeks. IOL: Yes, for A1 DM. Anesthesia: epidural.  Laceration: none.  Complications: none. Inpatient contraception: no.   Pregnancy complicated by A1DM and intrapartum GHTN. Tobacco use: no. Substance use disorder: no. Last pap smear:     Component Value Date/Time   DIAGPAP  11/01/2020 1030    - Negative for intraepithelial lesion or malignancy (NILM)   HPVHIGH Negative 11/01/2020 1030   ADEQPAP  11/01/2020 1030    Satisfactory for evaluation; transformation zone component ABSENT.     Next pap smear due: 10/25 Patient's last menstrual period was 11/06/2021.  Postpartum course has been uncomplicated. Bleeding no bleeding. Bowel function is normal. Bladder function is normal. Urinary incontinence? No, fecal incontinence? No Patient is not sexually active. Last sexual activity: prior to birth.    Upstream - 09/19/22 0913       Pregnancy Intention Screening   Does the patient want to become pregnant in the next year? No    Does the patient's partner want to become pregnant in the next year? No    Would the patient like to discuss contraceptive options today? Yes      Contraception Wrap Up   Current Method Abstinence    End Method Spermicide (Used Alone)   phexi   Contraception Counseling Provided Yes            The pregnancy intention screening data noted above was reviewed. Potential methods of contraception were discussed. The patient elected to proceed with Spermicide (Used Alone) (phexi).  Edinburgh Postpartum Depression Screening: Negative  Edinburgh Postnatal Depression Scale - 09/19/22 0914       Edinburgh Postnatal Depression Scale:  In the Past 7 Days   I have been able to laugh and see the funny  side of things. 0    I have looked forward with enjoyment to things. 0    I have blamed myself unnecessarily when things went wrong. 2    I have been anxious or worried for no good reason. 2    I have felt scared or panicky for no good reason. 0    Things have been getting on top of me. 2    I have been so unhappy that I have had difficulty sleeping. 0    I have felt sad or miserable. 1    I have been so unhappy that I have been crying. 0    The thought of harming myself has occurred to me. 0    Edinburgh Postnatal Depression Scale Total 7            Feels bad about how she looks on days where she is "just wearing a t shirt and sweats".   Recommended to make an effort to "get cute" at least a few days a week.  Agrees that would probably help. Offered Lunajoy, thinks that may help too. Link provided.    Baby's course has been uncomplicated. Baby is feeding by bottle. Infant has a pediatrician/family doctor? Yes.  Childcare strategy if returning to work/school: yes.  Pt has material needs met for her and baby: Yes.   Review of Systems:   Pertinent items are noted in HPI Denies Abnormal vaginal discharge w/ itching/odor/irritation, headaches, visual changes, shortness  of breath, chest pain, abdominal pain, severe nausea/vomiting, or problems with urination or bowel movements. Pertinent History Reviewed:  Reviewed past medical,surgical, obstetrical and family history.  Reviewed problem list, medications and allergies. OB History  Gravida Para Term Preterm AB Living  2 2 2     2   SAB IAB Ectopic Multiple Live Births        0 2    # Outcome Date GA Lbr Len/2nd Weight Sex Type Anes PTL Lv  2 Term 08/08/22 [redacted]w[redacted]d  7 lb 6.2 oz (3.35 kg) F Vag-Spont EPI  LIV  1 Term 04/01/21 [redacted]w[redacted]d 02:15 / 05:04 6 lb 14.4 oz (3.13 kg) M Vag-Spont EPI N LIV     Complications: Gestational diabetes, Gestational hypertension   Physical Assessment:   Vitals:   09/19/22 0908  BP: 105/67  Pulse: 72  Weight:  211 lb (95.7 kg)  Height: 5\' 3"  (1.6 m)  Body mass index is 37.38 kg/m.  Objective:  Blood pressure 105/67, pulse 72, height 5\' 3"  (1.6 m), weight 211 lb (95.7 kg), last menstrual period 11/06/2021, not currently breastfeeding.  General:  alert, cooperative, and no distress   Breasts:  negative  Lungs: Normal respiratory effort  Heart:  regular rate and rhythm  Abdomen: soft, non-tender,    Vulva:  Not evaluated  Vagina: Not evaluated  Cervix:  normal  Corpus: Well involuted  Adnexa:  not evaluated  Rectal Exam: no hemorrhoids          No results found for this or any previous visit (from the past 24 hour(s)).  Assessment & Plan:  1) Postpartum exam 2) 6 wks s/p spontaneous vaginal delivery 3) bottle feeding 4) Depression screening 5) Contraception counseling  Essential components of care per ACOG recommendations:  1.  Mood and well being:  If positive depression screen, discussed and plan developed.  If using tobacco we discussed reduction/cessation and risk of relapse If current substance abuse, we discussed and referral to local resources was offered.   2. Infant care and feeding:  If breastfeeding, discussed returning to work, pumping, breastfeeding-associated pain, guidance regarding return to fertility while lactating if not using another method. If needed, patient was provided with a letter to be allowed to pump q 2-3hrs to support lactation in a private location with access to a refrigerator to store breastmilk.   Recommended that all caregivers be immunized for flu, pertussis and other preventable communicable diseases If pt does not have material needs met for her/baby, referred to local resources for help obtaining these.  3. Sexuality, contraception and birth spacing Provided guidance regarding sexuality, management of dyspareunia, and resumption of intercourse Discussed avoiding interpregnancy interval <92mths and recommended birth spacing of 18 months  4.  Sleep and fatigue Discussed coping options for fatigue and sleep disruption Encouraged family/partner/community support of 4 hrs of uninterrupted sleep to help with mood and fatigue  5. Physical recovery  If pt had a C/S, assessed incisional pain and providing guidance on normal vs prolonged recovery If pt had a laceration, perineal healing and pain reviewed.  If urinary or fecal incontinence, discussed management and referred to PT or uro/gyn if indicated  Patient is safe to resume physical activity. Discussed attainment of healthy weight.  6.  Chronic disease management Discussed pregnancy complications if any, and their implications for future childbearing and long-term maternal health. Review recommendations for prevention of recurrent pregnancy complications, such as aspirin to reduce risk of preeclampsia yes. Pt had GDM: Yes. If yes, 2hr GTT  scheduled: A1C in 8 weeks. Did QID testing last week, all BS very normal. Reviewed medications and non-pregnant dosing including consideration of whether pt is breastfeeding using a reliable resource such as LactMed: not applicable Referred for f/u w/ PCP or subspecialist providers as indicated: not applicable  7. Health maintenance Mammogram at 28yo or earlier if indicated Pap smears as indicated  Meds:  Meds ordered this encounter  Medications   Lactic Ac-Citric Ac-Pot Bitart (PHEXXI) 1.8-1-0.4 % GEL    Sig: Place 5 g (1 applicator)  vaginally as needed before intercourse    Dispense:  5 g    Refill:  11    Order Specific Question:   Supervising Provider    Answer:   Duane Lope H [[id number]]    Follow-up: No follow-ups on file.   Orders Placed This Encounter  Procedures   HgB A1c       Jacklyn Shell DNP, CNM Center for [employer], [facility] 09/19/2022 9:41 AM

## 2022-09-19 NOTE — Patient Instructions (Addendum)
[facility] offers online women's holistic mental health counseling and therapy provided by licensed mental health counselors and therapists.   You can refer yourself using the link below: (if it isn't clickable from your mychart account, copy and paste it in a new browser).  If you have ANY problems, please let me know and I will help troubleshoot.   [email or web address]   Hgb A1C (check blood sugar metabolism) any time after 11/18/22

## 2022-10-16 ENCOUNTER — Ambulatory Visit (INDEPENDENT_AMBULATORY_CARE_PROVIDER_SITE_OTHER): Payer: BC Managed Care – PPO | Admitting: Podiatry

## 2022-10-16 ENCOUNTER — Encounter: Payer: Self-pay | Admitting: Podiatry

## 2022-10-16 DIAGNOSIS — L603 Nail dystrophy: Secondary | ICD-10-CM | POA: Diagnosis not present

## 2022-10-16 NOTE — Progress Notes (Signed)
Subjective:  Patient ID: Wanda Craig, female    DOB: 14-Sep-1995,  MRN: [id number] HPI Chief Complaint  Patient presents with   Nail Problem    Hallux right - lateral border, discolored and thick, was painful few weeks ago, but not anymore    27 y.o. female presents with the above complaint.   ROS: Denies fever chills nausea vomit muscle aches pains calf pain back pain chest pain shortness of breath  Past Medical History:  Diagnosis Date   Gall stones    Gestational diabetes    Pregnancy induced hypertension    Past Surgical History:  Procedure Laterality Date   NO PAST SURGERIES      Current Outpatient Medications:    cetirizine (ZYRTEC) 10 MG tablet, Take 10 mg by mouth daily., Disp: , Rfl:    Lactic Ac-Citric Ac-Pot Bitart (PHEXXI) 1.8-1-0.4 % GEL, Place 5 g (1 applicator)  vaginally as needed before intercourse, Disp: 5 g, Rfl: 11  No Known Allergies Review of Systems Objective:  There were no vitals filed for this visit.  General: Well developed, nourished, in no acute distress, alert and oriented x3   Dermatological: Skin is warm, dry and supple bilateral. Nails x 10 are well maintained; remaining integument appears unremarkable at this time. There are no open sores, no preulcerative lesions, no rash or signs of infection present.  Hallux nail right does demonstrate thin delaminated friable tissue with some discoloration.  There is some subungual debris as well.  There is no malodor there is no sign of bacterial infection or ingrown.  Vascular: Dorsalis Pedis artery and Posterior Tibial artery pedal pulses are 2/4 bilateral with immedate capillary fill time. Pedal hair growth present. No varicosities and no lower extremity edema present bilateral.   Neruologic: Grossly intact via light touch bilateral. Vibratory intact via tuning fork bilateral. Protective threshold with Semmes Wienstein monofilament intact to all pedal sites bilateral. Patellar and Achilles deep tendon  reflexes 2+ bilateral. No Babinski or clonus noted bilateral.   Musculoskeletal: No gross boney pedal deformities bilateral. No pain, crepitus, or limitation noted with foot and ankle range of motion bilateral. Muscular strength 5/5 in all groups tested bilateral.  Gait: Unassisted, Nonantalgic.    Radiographs:  None taken  Assessment & Plan:   Assessment: Nail dystrophy.    Plan: Sample of the nail and skin were taken for pathologic evaluation.     Ferlin Fairhurst T. [place], [place]

## 2022-10-30 ENCOUNTER — Telehealth: Payer: Self-pay | Admitting: *Deleted

## 2022-10-30 MED ORDER — TERBINAFINE HCL 250 MG PO TABS: 250.0000 mg | ORAL_TABLET | Freq: Every day | ORAL | 0 refills | Status: DC

## 2022-10-30 NOTE — Telephone Encounter (Signed)
Informed by MyChart-positive fungus culture, sent in lamisil #30 to start and she will need to follow up in 1 month for bloodwork and refill.

## 2022-10-30 NOTE — Addendum Note (Signed)
Addended by: Lottie Rater E on: 10/30/2022 04:50 PM   Modules accepted: Orders

## 2022-11-12 ENCOUNTER — Other Ambulatory Visit: Payer: Self-pay | Admitting: *Deleted

## 2022-11-12 MED ORDER — TERBINAFINE HCL 250 MG PO TABS: 250.0000 mg | ORAL_TABLET | Freq: Every day | ORAL | 0 refills | Status: DC

## 2022-11-13 ENCOUNTER — Ambulatory Visit: Payer: BC Managed Care – PPO | Admitting: Podiatry

## 2022-12-16 ENCOUNTER — Ambulatory Visit: Payer: BC Managed Care – PPO | Admitting: Podiatry

## 2023-10-21 ENCOUNTER — Other Ambulatory Visit (HOSPITAL_BASED_OUTPATIENT_CLINIC_OR_DEPARTMENT_OTHER): Payer: Self-pay

## 2023-12-24 ENCOUNTER — Encounter (HOSPITAL_BASED_OUTPATIENT_CLINIC_OR_DEPARTMENT_OTHER): Payer: Self-pay

## 2023-12-24 ENCOUNTER — Other Ambulatory Visit: Payer: Self-pay

## 2023-12-24 ENCOUNTER — Telehealth

## 2023-12-24 ENCOUNTER — Emergency Department (HOSPITAL_BASED_OUTPATIENT_CLINIC_OR_DEPARTMENT_OTHER)
Admission: EM | Admit: 2023-12-24 | Discharge: 2023-12-24 | Disposition: A | Discharge status: Home / Self Care | Attending: Emergency Medicine | Admitting: Emergency Medicine

## 2023-12-24 ENCOUNTER — Emergency Department (HOSPITAL_BASED_OUTPATIENT_CLINIC_OR_DEPARTMENT_OTHER)

## 2023-12-24 DIAGNOSIS — R109 Unspecified abdominal pain: Secondary | ICD-10-CM | POA: Diagnosis present

## 2023-12-24 DIAGNOSIS — K8020 Calculus of gallbladder without cholecystitis without obstruction: Secondary | ICD-10-CM

## 2023-12-24 DIAGNOSIS — K8021 Calculus of gallbladder without cholecystitis with obstruction: Secondary | ICD-10-CM | POA: Insufficient documentation

## 2023-12-24 DIAGNOSIS — R1011 Right upper quadrant pain: Secondary | ICD-10-CM | POA: Diagnosis present

## 2023-12-24 LAB — CBC WITH DIFFERENTIAL/PLATELET
Abs Immature Granulocytes: 0.03 K/uL (ref 0.00–0.07)
Basophils Absolute: 0 K/uL (ref 0.0–0.1)
Basophils Relative: 0 %
Eosinophils Absolute: 0.1 K/uL (ref 0.0–0.5)
Eosinophils Relative: 1 %
HCT: 39.6 % (ref 36.0–46.0)
Hemoglobin: 13.3 g/dL (ref 12.0–15.0)
Immature Granulocytes: 0 %
Lymphocytes Relative: 17 %
Lymphs Abs: 1.6 K/uL (ref 0.7–4.0)
MCH: 29.8 pg (ref 26.0–34.0)
MCHC: 33.6 g/dL (ref 30.0–36.0)
MCV: 88.8 fL (ref 80.0–100.0)
Monocytes Absolute: 0.4 K/uL (ref 0.1–1.0)
Monocytes Relative: 5 %
Neutro Abs: 7.1 K/uL (ref 1.7–7.7)
Neutrophils Relative %: 77 %
Platelets: 336 K/uL (ref 150–400)
RBC: 4.46 MIL/uL (ref 3.87–5.11)
RDW: 12.9 % (ref 11.5–15.5)
WBC: 9.2 K/uL (ref 4.0–10.5)
nRBC: 0 % (ref 0.0–0.2)

## 2023-12-24 LAB — COMPREHENSIVE METABOLIC PANEL WITH GFR
ALT: 593 U/L — ABNORMAL HIGH (ref 0–44)
AST: 433 U/L — ABNORMAL HIGH (ref 15–41)
Albumin: 4.5 g/dL (ref 3.5–5.0)
Alkaline Phosphatase: 433 U/L — ABNORMAL HIGH (ref 38–126)
Anion gap: 12 (ref 5–15)
BUN: 8 mg/dL (ref 6–20)
CO2: 24 mmol/L (ref 22–32)
Calcium: 9.5 mg/dL (ref 8.9–10.3)
Chloride: 101 mmol/L (ref 98–111)
Creatinine, Ser: 0.61 mg/dL (ref 0.44–1.00)
GFR, Estimated: >60 mL/min (ref >60)
Glucose, Bld: 107 mg/dL — ABNORMAL HIGH (ref 70–99)
Potassium: 4 mmol/L (ref 3.5–5.1)
Sodium: 136 mmol/L (ref 135–145)
Total Bilirubin: 1.4 mg/dL — ABNORMAL HIGH (ref 0.0–1.2)
Total Protein: 7.8 g/dL (ref 6.5–8.1)

## 2023-12-24 LAB — LIPASE, BLOOD: Lipase: 31 U/L (ref 11–51)

## 2023-12-24 MED ORDER — ONDANSETRON 4 MG PO TBDP: 4.0000 mg | ORAL_TABLET | Freq: Three times a day (TID) | ORAL | 0 refills | Status: DC | PRN

## 2023-12-24 MED ORDER — TRAMADOL HCL 50 MG PO TABS: 50.0000 mg | ORAL_TABLET | Freq: Four times a day (QID) | ORAL | 0 refills | Status: DC | PRN

## 2023-12-24 MED ORDER — LACTATED RINGERS IV SOLN: INTRAVENOUS | Status: DC

## 2023-12-24 NOTE — ED Provider Notes (Signed)
 [place] EMERGENCY DEPARTMENT AT [facility] Provider Note   CSN: [id number] Arrival date & time: 12/24/23  1653     Patient presents with: Abdominal Pain   Wanda Craig is a 28 y.o. female.   Patient to ED with RUQ abdominal pain similar to symptomatic gall stones she states she has been dealing with for years. She feels she can usually manage symptoms with diet and hydration but today the pain started this morning and has been constant and intense, which she is concerned indicates worsening disease. No fever. Not vomiting.   The history is provided by the patient. No language interpreter was used.  Abdominal Pain      Prior to Admission medications   Medication Sig Start Date End Date Taking? Authorizing Provider  ondansetron  (ZOFRAN -ODT) 4 MG disintegrating tablet Take 1 tablet (4 mg total) by mouth every 8 (eight) hours as needed for nausea or vomiting. 12/24/23  Yes Rilda Bulls, Margit, PA-C  traMADol  (ULTRAM ) 50 MG tablet Take 1 tablet (50 mg total) by mouth every 6 (six) hours as needed for severe pain (pain score 7-10). 12/24/23  Yes Odell Margit, PA-C  cetirizine (ZYRTEC) 10 MG tablet Take 10 mg by mouth daily.    [provider]  Lactic Ac-Citric Ac-Pot Bitart (PHEXXI ) 1.8-1-0.4 % GEL Place 5 g (1 applicator)  vaginally as needed before intercourse 09/19/22   Cresenzo-Dishmon, Cathlean, CNM  terbinafine  (LAMISIL ) 250 MG tablet Take 1 tablet (250 mg total) by mouth daily. 11/12/22   Hyatt, Max T, DPM    Allergies: Patient has no known allergies.    Review of Systems  Gastrointestinal:  Positive for abdominal pain.    Updated Vital Signs BP 119/61   Pulse 85   Temp 97.8 F (36.6 C)   Resp 17   SpO2 100%   Physical Exam Vitals reviewed.  Constitutional:      Appearance: She is well-developed.  HENT:     Head: Normocephalic.     Mouth/Throat:     Mouth: Mucous membranes are moist.  Cardiovascular:     Rate and Rhythm: Normal rate.   Pulmonary:     Effort: Pulmonary effort is normal.  Abdominal:     Palpations: Abdomen is soft.     Tenderness: There is abdominal tenderness (RUQ > epigastric tenderness). There is no guarding or rebound.  Musculoskeletal:        General: Normal range of motion.     Cervical back: Normal range of motion and neck supple.  Skin:    General: Skin is warm and dry.  Neurological:     General: No focal deficit present.     Mental Status: She is alert and oriented to person, place, and time.     (all labs ordered are listed, but only abnormal results are displayed) Labs Reviewed  COMPREHENSIVE METABOLIC PANEL WITH GFR - Abnormal; Notable for the following components:      Result Value   Glucose, Bld 107 (*)    AST 433 (*)    ALT 593 (*)    Alkaline Phosphatase 433 (*)    Total Bilirubin 1.4 (*)    All other components within normal limits  CBC WITH DIFFERENTIAL/PLATELET  LIPASE, BLOOD  HCG, SERUM, QUALITATIVE    EKG: None  Radiology: US  Abdomen Limited RUQ (LIVER/GB) Result Date: 12/24/2023 CLINICAL DATA:  Right upper quadrant abdominal pain. EXAM: ULTRASOUND ABDOMEN LIMITED RIGHT UPPER QUADRANT COMPARISON:  None Available. FINDINGS: Gallbladder: Multiple gallstones. No gallbladder wall thickening or  pericholecystic fluid. Negative sonographic Murphy sign. Common bile duct: Diameter: 7 mm Liver: No focal lesion identified. Within normal limits in parenchymal echogenicity. Portal vein is patent on color Doppler imaging with normal direction of blood flow towards the liver. Other: None. IMPRESSION: Cholelithiasis without sonographic evidence of acute cholecystitis. Electronically Signed   By: Vanetta Chou M.D.   On: 12/24/2023 19:04     Procedures   Medications Ordered in the ED  lactated ringers  infusion (has no administration in time range)    Clinical Course as of 12/24/23 2109  Wed Dec 24, 2023  1732 Patient to ED with RUQ abdominal pain similar to gall stone  pain, but worse and constant. No fever. Labs, US  pending. Patient declines pain medication for now. [SU]  1940 Labs consistent with elevated LFT's with AST 433, ALT 593, alk phos 433, total bili 1.4. Last comparable labs 07/2023 where these were normal. No leukocytosis. US  showing:  IMPRESSION: Cholelithiasis without sonographic evidence of acute cholecystitis.   CBD: 7 mm [SU]  1945 The patient states she would prefer to move forward with cholecystectomy rather than to try pain control. Will consult GI regarding need to evaluate for obstruction with MRCP/ERCP. Dr. Dianna paged.  [SU]  2014 Discussed with Dr. Dianna who advises she will need an MRCP. Admit to hospitalist, GI will see tomorrow and will make recommendation. [SU]  2058 I spoke with Dr. Franky, TRH, who accepted the patient onto his service for admission. Subsequently the patient decided against being admitted. She states she would prefer to follow up outpatient. Discussed potential delay in care, worsening symptoms including fever, severe pain, uncontrolled vomiting. Hospitalist and specialist (Dr. Dianna) made aware.  [SU]    Clinical Course User Index [SU] Odell Balls, PA-C                                 Medical Decision Making Amount and/or Complexity of Data Reviewed Labs: ordered. Radiology: ordered.        Final diagnoses:  Calculus of gallbladder with biliary obstruction but without cholecystitis  Symptomatic cholelithiasis    ED Discharge Orders          Ordered    traMADol  (ULTRAM ) 50 MG tablet  Every 6 hours PRN        12/24/23 2104    ondansetron  (ZOFRAN -ODT) 4 MG disintegrating tablet  Every 8 hours PRN        12/24/23 2104               Odell Balls, PA-C 12/24/23 2109    Pamella Ozell LABOR, DO 12/31/23 [phone]

## 2023-12-24 NOTE — Discharge Instructions (Addendum)
 As we discussed, you have decided against being admitted to the hospital tonight. This will cause a delay in your evaluation and treatment of known gall stones, now with signs of possible biliary obstruction with new elevation of your liver studies. It is important to return to the ED with any severe pain, if you develop a fever, have uncontrolled vomiting or new related symptom of concern.   Please call Dr. Dianna with gastroenterology to schedule an appointment for further evaluation of known gall stones.   Prescriptions for pain and nausea medication have been sent to your pharmacy.

## 2023-12-24 NOTE — ED Triage Notes (Signed)
 Pt c/o intermittent gallbladder attacks/ pain x49mo, advises hx cholelithiasis, I'm worried that the stones are stuck. Denies NV, but the pain is so bad it makes me nauseous.

## 2024-02-09 ENCOUNTER — Other Ambulatory Visit: Payer: Self-pay | Admitting: *Deleted

## 2024-02-09 DIAGNOSIS — K8020 Calculus of gallbladder without cholecystitis without obstruction: Secondary | ICD-10-CM

## 2024-02-10 ENCOUNTER — Ambulatory Visit: Admitting: Surgery

## 2024-02-10 ENCOUNTER — Encounter: Payer: Self-pay | Admitting: Surgery

## 2024-02-10 ENCOUNTER — Other Ambulatory Visit: Payer: Self-pay

## 2024-02-10 VITALS — BP 112/73 | HR 89 | Temp 98.3°F | Resp 16 | Ht 62.0 in | Wt 215.0 lb

## 2024-02-10 DIAGNOSIS — K8020 Calculus of gallbladder without cholecystitis without obstruction: Secondary | ICD-10-CM

## 2024-02-10 NOTE — Progress Notes (Unsigned)
 [facility] History and Physical  Reason for Referral: Cholelithiasis Referring Physician: Rocco Hoh, FNP  Chief Complaint   New Patient (Initial Visit)     Wanda Craig is a 29 y.o. female.  HPI: Patient presents for evaluation of cholelithiasis.  She first began having issues with her gallbladder during her first pregnancy in 2022.  She then denied any issues until her subsequent pregnancy a couple of years later.  She then had a significant episode that resulted in presentation to the emergency department in November 2025.  At that time, she had severe right upper quadrant abdominal pain that radiated along the right side of her abdomen.  When she had had these episodes prior, the pain only lasted 25 to 30 minutes, however this lasted significantly longer.  While in the emergency department, she was noted to have significantly elevated LFTs and CBD was dilated at 7 mm on ultrasound, concerning for possible choledocholithiasis.  There was plan for admission for MRCP, however patient decided to follow-up outpatient with her primary care provider.  Following her ED evaluation, patient's pain resolved.  She underwent repeat blood work with her PCP, which demonstrated normalization of her total bilirubin, and AST.  Alkaline phosphatase improved to 229 and ALT improved to 71.  Prior to presentation to the emergency department, she had issues with pain 3-4 times a week for 3 weeks leading up to this visit.  She confirms nausea and vomiting with these episodes.  Since discharge from the emergency department, she has not had any further episodes.  She denies any significant past medical history and denies use of blood thinning medications.  She has never had any abdominal surgeries.  She denies use of tobacco products, alcohol, and illicit drugs.  Past Medical History:  Diagnosis Date   Gall stones    Gestational diabetes    Pregnancy induced hypertension     Past Surgical  History:  Procedure Laterality Date   NO PAST SURGERIES      Family History  Problem Relation Age of Onset   Mental illness Paternal Grandmother    Schizophrenia Paternal Grandmother    Dementia Maternal Grandmother    Arthritis Mother    Breast cancer Maternal Aunt    Diabetes Paternal Uncle     Social History[1]  Medications: I have reviewed the patient's current medications. Allergies as of 02/10/2024       Reactions   Acetaminophen  Nausea And Vomiting        Medication List        Accurate as of February 10, 2024  3:15 PM. If you have any questions, ask your nurse or doctor.          STOP taking these medications    ondansetron  4 MG disintegrating tablet Commonly known as: ZOFRAN -ODT Stopped by: Dorothyann Olla Delancey, DO   Phexxi  1.8-1-0.4 % Gel Generic drug: Lactic Ac-Citric Ac-Pot Bitart Stopped by: Dorothyann Shaundrea Carrigg, DO   terbinafine  250 MG tablet Commonly known as: LamISIL  Stopped by: Dorothyann Zachari Alberta, DO   traMADol  50 MG tablet Commonly known as: ULTRAM  Stopped by: Dorothyann Zakyia Gagan, DO       TAKE these medications    cetirizine 10 MG tablet Commonly known as: ZYRTEC Take 10 mg by mouth daily.         ROS:  Constitutional: negative for chills, fatigue, and fevers Eyes: negative for visual disturbance and pain Ears, nose, mouth, throat, and face: negative for ear drainage, sore throat, and sinus problems Respiratory: negative for  cough, wheezing, and shortness of breath Cardiovascular: negative for chest pain and palpitations Gastrointestinal: negative for abdominal pain, nausea, reflux symptoms, and vomiting Genitourinary:negative for dysuria and frequency Integument/breast: negative for dryness and rash Hematologic/lymphatic: negative for bleeding and lymphadenopathy Musculoskeletal:negative for back pain and neck pain Neurological: negative for dizziness and tremors Endocrine: negative for temperature  intolerance  Blood pressure 112/73, pulse 89, temperature 98.3 F (36.8 C), temperature source Oral, resp. rate 16, height 5' 2 (1.575 m), weight 215 lb (97.5 kg), last menstrual period 01/22/2024, SpO2 95%, not currently breastfeeding. Physical Exam Vitals reviewed.  Constitutional:      Appearance: Normal appearance.  HENT:     Head: Normocephalic and atraumatic.  Eyes:     Extraocular Movements: Extraocular movements intact.     Pupils: Pupils are equal, round, and reactive to light.  Cardiovascular:     Rate and Rhythm: Normal rate and regular rhythm.  Pulmonary:     Effort: Pulmonary effort is normal.     Breath sounds: Normal breath sounds.  Abdominal:     Comments: Abdomen soft, nondistended, no percussion tenderness, nontender to palpation; no rigidity, guarding, rebound tenderness; negative Murphy sign  Musculoskeletal:        General: Normal range of motion.     Cervical back: Normal range of motion.  Skin:    General: Skin is warm and dry.  Neurological:     General: No focal deficit present.     Mental Status: She is alert and oriented to person, place, and time.  Psychiatric:        Mood and Affect: Mood normal.        Behavior: Behavior normal.     Results: Abdominal US  (12/24/23): IMPRESSION: Cholelithiasis without sonographic evidence of acute cholecystitis.  Assessment & Plan:  Wanda Craig is a 29 y.o. female who presents for evaluation of cholelithiasis.  -We discussed the pathophysiology of gallbladder disease and the recommendations for cholecystectomy -I counseled the patient about the indication, risks and benefits of robotic assisted laparoscopic cholecystectomy.  She understands there is a very small chance for bleeding, infection, injury to normal structures (including common bile duct), conversion to open surgery, persistent symptoms, evolution of postcholecystectomy diarrhea, need for secondary interventions, anesthesia reaction,  cardiopulmonary issues and other risks not specifically detailed here. I described the expected recovery, the plan for follow-up and the restrictions during the recovery phase.  All questions were answered. -Patient tentatively scheduled for surgery on 1/22 -Given the concern for choledocholithiasis at the time of ED visit, I would like the patient to have a CMP performed at her preoperative appointment to verify that her LFTs have improved/remained normalized -Information provided to the patient regarding cholelithiasis, cholecystitis, cholecystectomy, and low fat diet -Advised to present to the ED if she begins to have worsening abdominal pain, nausea, vomiting, fever, chills, and jaundiced skin  All questions were answered to the satisfaction of the patient.  Note: Portions of this report may have been transcribed using voice recognition software. Every effort has been made to ensure accuracy; however, inadvertent computerized transcription errors may still be present.   Dorothyann Brittle, DO [facility] [address] [place], [place] [address] [phone] (office)          [1]  Social History Tobacco Use   Smoking status: Never   Smokeless tobacco: Never  Vaping Use   Vaping status: Never Used  Substance Use Topics   Alcohol use: Not Currently   Drug use: No

## 2024-02-11 NOTE — H&P (Signed)
 [facility] History and Physical  Reason for Referral: Cholelithiasis Referring Physician: Rocco Hoh, FNP  Chief Complaint   New Patient (Initial Visit)     Wanda Craig is a 29 y.o. female.  HPI: Patient presents for evaluation of cholelithiasis.  She first began having issues with her gallbladder during her first pregnancy in 2022.  She then denied any issues until her subsequent pregnancy a couple of years later.  She then had a significant episode that resulted in presentation to the emergency department in November 2025.  At that time, she had severe right upper quadrant abdominal pain that radiated along the right side of her abdomen.  When she had had these episodes prior, the pain only lasted 25 to 30 minutes, however this lasted significantly longer.  While in the emergency department, she was noted to have significantly elevated LFTs and CBD was dilated at 7 mm on ultrasound, concerning for possible choledocholithiasis.  There was plan for admission for MRCP, however patient decided to follow-up outpatient with her primary care provider.  Following her ED evaluation, patient's pain resolved.  She underwent repeat blood work with her PCP, which demonstrated normalization of her total bilirubin, and AST.  Alkaline phosphatase improved to 229 and ALT improved to 71.  Prior to presentation to the emergency department, she had issues with pain 3-4 times a week for 3 weeks leading up to this visit.  She confirms nausea and vomiting with these episodes.  Since discharge from the emergency department, she has not had any further episodes.  She denies any significant past medical history and denies use of blood thinning medications.  She has never had any abdominal surgeries.  She denies use of tobacco products, alcohol, and illicit drugs.  Past Medical History:  Diagnosis Date   Gall stones    Gestational diabetes    Pregnancy induced hypertension     Past Surgical  History:  Procedure Laterality Date   NO PAST SURGERIES      Family History  Problem Relation Age of Onset   Mental illness Paternal Grandmother    Schizophrenia Paternal Grandmother    Dementia Maternal Grandmother    Arthritis Mother    Breast cancer Maternal Aunt    Diabetes Paternal Uncle     [Social History]  [Social History] Tobacco Use   Smoking status: Never   Smokeless tobacco: Never  Vaping Use   Vaping status: Never Used  Substance Use Topics   Alcohol use: Not Currently   Drug use: No    Medications: I have reviewed the patient's current medications. Allergies as of 02/10/2024       Reactions   Acetaminophen  Nausea And Vomiting        Medication List        Accurate as of February 10, 2024  3:15 PM. If you have any questions, ask your nurse or doctor.          STOP taking these medications    ondansetron  4 MG disintegrating tablet Commonly known as: ZOFRAN -ODT Stopped by: Dorothyann Azavion Bouillon, DO   Phexxi  1.8-1-0.4 % Gel Generic drug: Lactic Ac-Citric Ac-Pot Bitart Stopped by: Dorothyann Holmes Hays, DO   terbinafine  250 MG tablet Commonly known as: LamISIL  Stopped by: Dorothyann Ghassan Coggeshall, DO   traMADol  50 MG tablet Commonly known as: ULTRAM  Stopped by: Dorothyann Keller Mikels, DO       TAKE these medications    cetirizine 10 MG tablet Commonly known as: ZYRTEC Take 10 mg by mouth daily.  ROS:  Constitutional: negative for chills, fatigue, and fevers Eyes: negative for visual disturbance and pain Ears, nose, mouth, throat, and face: negative for ear drainage, sore throat, and sinus problems Respiratory: negative for cough, wheezing, and shortness of breath Cardiovascular: negative for chest pain and palpitations Gastrointestinal: negative for abdominal pain, nausea, reflux symptoms, and vomiting Genitourinary:negative for dysuria and frequency Integument/breast: negative for dryness and rash Hematologic/lymphatic:  negative for bleeding and lymphadenopathy Musculoskeletal:negative for back pain and neck pain Neurological: negative for dizziness and tremors Endocrine: negative for temperature intolerance  Blood pressure 112/73, pulse 89, temperature 98.3 F (36.8 C), temperature source Oral, resp. rate 16, height 5' 2 (1.575 m), weight 215 lb (97.5 kg), last menstrual period 01/22/2024, SpO2 95%, not currently breastfeeding. Physical Exam Vitals reviewed.  Constitutional:      Appearance: Normal appearance.  HENT:     Head: Normocephalic and atraumatic.  Eyes:     Extraocular Movements: Extraocular movements intact.     Pupils: Pupils are equal, round, and reactive to light.  Cardiovascular:     Rate and Rhythm: Normal rate and regular rhythm.  Pulmonary:     Effort: Pulmonary effort is normal.     Breath sounds: Normal breath sounds.  Abdominal:     Comments: Abdomen soft, nondistended, no percussion tenderness, nontender to palpation; no rigidity, guarding, rebound tenderness; negative Murphy sign  Musculoskeletal:        General: Normal range of motion.     Cervical back: Normal range of motion.  Skin:    General: Skin is warm and dry.  Neurological:     General: No focal deficit present.     Mental Status: She is alert and oriented to person, place, and time.  Psychiatric:        Mood and Affect: Mood normal.        Behavior: Behavior normal.     Results: Abdominal US  (12/24/23): IMPRESSION: Cholelithiasis without sonographic evidence of acute cholecystitis.  Assessment & Plan:  Wanda Craig is a 29 y.o. female who presents for evaluation of cholelithiasis.  -We discussed the pathophysiology of gallbladder disease and the recommendations for cholecystectomy -I counseled the patient about the indication, risks and benefits of robotic assisted laparoscopic cholecystectomy.  She understands there is a very small chance for bleeding, infection, injury to normal structures  (including common bile duct), conversion to open surgery, persistent symptoms, evolution of postcholecystectomy diarrhea, need for secondary interventions, anesthesia reaction, cardiopulmonary issues and other risks not specifically detailed here. I described the expected recovery, the plan for follow-up and the restrictions during the recovery phase.  All questions were answered. -Patient tentatively scheduled for surgery on 1/22 -Given the concern for choledocholithiasis at the time of ED visit, I would like the patient to have a CMP performed at her preoperative appointment to verify that her LFTs have improved/remained normalized -Information provided to the patient regarding cholelithiasis, cholecystitis, cholecystectomy, and low fat diet -Advised to present to the ED if she begins to have worsening abdominal pain, nausea, vomiting, fever, chills, and jaundiced skin  All questions were answered to the satisfaction of the patient.  Note: Portions of this report may have been transcribed using voice recognition software. Every effort has been made to ensure accuracy; however, inadvertent computerized transcription errors may still be present.   Dorothyann Brittle, DO [facility] [address] [place], [place] [address] [phone] (office)

## 2024-02-12 NOTE — Patient Instructions (Addendum)
 "  Your procedure is scheduled on 02/19/24.  Report to [facility] Main Entrance at 7:40 A.M.   Call this number if you have problems the morning of surgery:  [phone]  If you experience any cold or flu symptoms such as cough, fever, chills, shortness of breath, etc. between now and your scheduled surgery, please notify us  at the above number.   Remember:  Do not eat after midnight.   You may drink clear liquids until  5:30 am .    Clear liquids allowed are:  Water, Juice (No red color; non-citric and without pulp; diabetics please choose diet or no sugar options), Carbonated beverages (diabetics please choose diet or no sugar options), Clear Tea (No creamer, milk, or cream, including half & half and powdered creamer), Black Coffee Only (No creamer, milk or cream, including half & half and powdered creamer), and Clear Sports drink (No red color; diabetics please choose diet or no sugar options)    Take these medicines the morning of surgery with A SIP OF WATER NONE   Use inhaler if needed.     Do not wear jewelry, make-up or nail polish, including gel polish,  artificial nails, or any other type of covering on natural nails.  Do not wear lotions, powders, or perfumes, or deodorant.  Do not shave 48 hours prior to surgery.   Do not bring valuables to the hospital.  [facility] is not responsible for any belongings or valuables.  Contacts, dentures or bridgework may not be worn into surgery.  Leave your suitcase in the car.  After surgery it may be brought to your room.  For patients admitted to the hospital, discharge time will be determined by your treatment team.  Patients discharged the day of surgery will not be allowed to drive home and must have someone be with them for 24 hours.   Special instructions: DO NOT SMOKE TOBACCO OR VAPE 24 HOURS PRIOR TO YOUR PROCEDURE.   Please brush your teeth morning of procedure.   Please read over the following: Anesthesia  Post-op Instructions and Care and Recovery After Surgery  How to Use Chlorhexidine at Home in the Shower Chlorhexidine gluconate (CHG) is a germ-killing (antiseptic) wash that's used to clean the skin. It can get rid of the germs that normally live on the skin and can keep them away for about 24 hours. If you're having surgery, you may be told to shower with CHG at home the night before surgery. This can help lower your risk for infection. To use CHG wash in the shower, follow the steps below. Supplies needed: CHG body wash. Clean washcloth. Clean towel. How to use CHG in the shower Follow these steps unless you're told to use CHG in a different way: Start the shower. Use your normal soap and shampoo to wash your face and hair. Turn off the shower or move out of the shower stream. Pour CHG onto a clean washcloth. Do not use any type of brush or rough sponge. Start at your neck, washing your body down to your toes. Make sure you: Wash the part of your body where the surgery will be done for at least 1 minute. Do not scrub. Do not use CHG on your head or face unless your health care provider tells you to. If it gets into your ears or eyes, rinse them well with water. Do not wash your genitals with CHG. Wash your back and under your arms. Make sure to wash skin  folds. Let the CHG sit on your skin for 1-2 minutes or as long as told. Rinse your entire body in the shower, including all body creases and folds. Turn off the shower. Dry off with a clean towel. Do not put anything on your skin afterward, such as powder, lotion, or perfume. Put on clean clothes or pajamas. If it's the night before surgery, sleep in clean sheets. General tips Use CHG only as told, and follow the instructions on the label. Use the full amount of CHG as told. This is often one bottle. Do not smoke and stay away from flames after using CHG. Your skin may feel sticky after using CHG. This is normal. The sticky  feeling will go away as the CHG dries. Do not use CHG: If you have a chlorhexidine allergy or have reacted to chlorhexidine in the past. On open wounds or areas of skin that have broken skin, cuts, or scrapes. On babies younger than 105 months of age. Contact a health care provider if: You have questions about using CHG. Your skin gets irritated or itchy. You have a rash after using CHG. You swallow any CHG. Call your local poison control center [phone] in the [place] Your eyes itch badly, or they become very red or swollen. Your hearing changes. You have trouble seeing. If you can't reach your provider, go to an urgent care or emergency room. Do not drive yourself. Get help right away if: You have swelling or tingling in your mouth or throat. You make high-pitched whistling sounds when you breathe, most often when you breathe out (wheeze). You have trouble breathing. These symptoms may be an emergency. Call 911 right away. Do not wait to see if the symptoms will go away. Do not drive yourself to the hospital. This information is not intended to replace advice given to you by your health care provider. Make sure you discuss any questions you have with your health care provider. Document Revised: 07/30/2022 Document Reviewed: 07/26/2021 Elsevier Patient Education  2024 [employer]  General Anesthesia, Adult, Care After The following information offers guidance on how to care for yourself after your procedure. Your health care provider may also give you more specific instructions. If you have problems or questions, contact your health care provider. What can I expect after the procedure? After the procedure, it is common for people to: Have pain or discomfort at the IV site. Have nausea or vomiting. Have a sore throat or hoarseness. Have trouble concentrating. Feel cold or chills. Feel weak, sleepy, or tired (fatigue). Have soreness and body aches. These can affect parts of the  body that were not involved in surgery. Follow these instructions at home: For the time period you were told by your health care provider:  Rest. Do not participate in activities where you could fall or become injured. Do not drive or use machinery. Do not drink alcohol. Do not take sleeping pills or medicines that cause drowsiness. Do not make important decisions or sign legal documents. Do not take care of children on your own. General instructions Drink enough fluid to keep your urine pale yellow. If you have sleep apnea, surgery and certain medicines can increase your risk for breathing problems. Follow instructions from your health care provider about wearing your sleep device: Anytime you are sleeping, including during daytime naps. While taking prescription pain medicines, sleeping medicines, or medicines that make you drowsy. Return to your normal activities as told by your health care provider. Ask your health  care provider what activities are safe for you. Take over-the-counter and prescription medicines only as told by your health care provider. Do not use any products that contain nicotine or tobacco. These products include cigarettes, chewing tobacco, and vaping devices, such as e-cigarettes. These can delay incision healing after surgery. If you need help quitting, ask your health care provider. Contact a health care provider if: You have nausea or vomiting that does not get better with medicine. You vomit every time you eat or drink. You have pain that does not get better with medicine. You cannot urinate or have bloody urine. You develop a skin rash. You have a fever. Get help right away if: You have trouble breathing. You have chest pain. You vomit blood. These symptoms may be an emergency. Get help right away. Call 911. Do not wait to see if the symptoms will go away. Do not drive yourself to the hospital. Summary After the procedure, it is common to have a sore  throat, hoarseness, nausea, vomiting, or to feel weak, sleepy, or fatigue. For the time period you were told by your health care provider, do not drive or use machinery. Get help right away if you have difficulty breathing, have chest pain, or vomit blood. These symptoms may be an emergency. This information is not intended to replace advice given to you by your health care provider. Make sure you discuss any questions you have with your health care provider. Document Revised: 04/13/2021 Document Reviewed: 04/13/2021 Elsevier Patient Education  2024 [employer]  Minimally Invasive Cholecystectomy, Care After What can I expect after the procedure? After the procedure, it is common to: Have pain at the areas of surgery. You will be given medicines for pain. Vomit or feel like you may vomit. Feel fullness in the belly (bloating) or have pain in the shoulder. This comes from the gas that was used during the surgery. Follow these instructions at home: Medicines Take over-the-counter and prescription medicines only as told by your doctor. If you were prescribed an antibiotic medicine, take it as told by your doctor. Do not stop taking it even if you start to feel better. If told, take steps to prevent problems with pooping (constipation). You may need to: Drink enough fluid to keep your pee (urine) pale yellow. Take medicines. You will be told what medicines to take. Eat foods that are high in fiber. These include beans, whole grains, and fresh fruits and vegetables. Limit foods that are high in fat and sugar. These include fried or sweet foods. Ask your doctor if you should avoid driving or using machines while you are taking your medicine. Incision care  Follow instructions from your doctor about how to take care of your cuts from surgery (incisions). Make sure you: Wash your hands with soap and water for at least 20 seconds before and after you change your bandage (dressing). If you cannot  use soap and water, use hand sanitizer. Change your bandage. Leave stitches (sutures) or skin glue in place for at least 2 weeks. Leave tape strips alone unless you are told to take them off. You may trim the edges of the tape strips if they curl up. Do not take baths, swim, or use a hot tub. Ask your doctor about taking showers or sponge baths. Check your incision area every day for signs of infection. Check for: More redness, swelling, or pain. Fluid or blood. Warmth. Pus or a bad smell. Activity Rest as told by your doctor. Do not  do activities that require a lot of effort. Get up to take short walks every 1 to 2 hours. Ask for help if you feel weak or unsteady. Do not lift anything that is heavier than 10 lb (4.5 kg), or the limit that you are told. Do not play contact sports until your doctor says it is okay. Do not return to work or school until your doctor says it is okay. Return to your normal activities when your doctor says that it is safe. General instructions If you were given a sedative during your procedure, do not drive or use machines until your doctor says that it is safe. A sedative is a medicine that helps you relax. Keep all follow-up visits. Contact a doctor if: You get a rash. You have more redness, swelling, or pain around your incisions. You have fluid or blood coming from your incisions. Your incisions feel warm to the touch. You have pus or a bad smell coming from your incisions. You have a fever. One or more of your incisions breaks open. Get help right away if: You have trouble breathing. You have chest pain. You have pain that is getting worse in your shoulders. You faint or feel dizzy when you stand. You have very bad pain in your belly (abdomen). You feel like you may vomit or you vomit, and this lasts for more than one day. You have leg pain. These symptoms may be an emergency. Get help right away. Call 911. Do not wait to see if the symptoms will  go away. Do not drive yourself to the hospital. Summary After your surgery, it is common to have pain at the areas of surgery. You may also vomit or feel fullness in the belly. Follow your doctor's instructions about medicine, activity restrictions, and caring for your surgery areas. Do not do activities that require a lot of effort. Contact a doctor if you have a fever or other signs of infection, such as more redness, swelling, or pain around your incisions. Get help right away if you have chest pain, increasing pain in the shoulders, or trouble breathing. This information is not intended to replace advice given to you by your health care provider. Make sure you discuss any questions you have with your health care provider. Document Revised: 07/17/2020 Document Reviewed: 07/18/2020 Elsevier Patient Education  2024 [employer].     "

## 2024-02-16 ENCOUNTER — Encounter (HOSPITAL_COMMUNITY): Payer: Self-pay

## 2024-02-16 ENCOUNTER — Encounter (HOSPITAL_COMMUNITY)
Admission: RE | Admit: 2024-02-16 | Discharge: 2024-02-16 | Disposition: A | Discharge status: Home / Self Care | Source: Ambulatory Visit | Attending: Surgery | Admitting: Surgery

## 2024-02-16 ENCOUNTER — Telehealth: Payer: Self-pay | Admitting: *Deleted

## 2024-02-16 DIAGNOSIS — Z01818 Encounter for other preprocedural examination: Secondary | ICD-10-CM

## 2024-02-16 HISTORY — DX: Nausea with vomiting, unspecified: R11.2

## 2024-02-16 HISTORY — DX: Personal history of gestational diabetes: Z86.32

## 2024-02-16 NOTE — Telephone Encounter (Addendum)
 Surgical Date: 02/19/2024 Procedure: XI ROBOTIC ASSISTED LAPAROSCOPIC CHOLECYSTECTOMY   Received call from patient [phone]~ telephone.   Reports that she tested positive for strep throat.   Per facility protocol, surgery to be delayed x2 weeks due to illness.   Re-scheduled for 03/03/2024.  Call placed to patient and patient made aware.

## 2024-02-19 DIAGNOSIS — K8020 Calculus of gallbladder without cholecystitis without obstruction: Secondary | ICD-10-CM

## 2024-02-26 NOTE — Patient Instructions (Signed)
 "  Your procedure is scheduled on: 03/03/2024  Report to [facility] Main Entrance at    6:00 AM.  Call this number if you have problems the morning of surgery: [phone]   Remember:   Do not Eat or Drink after midnight         No Smoking the morning of surgery  :  Take these medicines the morning of surgery with A SIP OF WATER: Valium if needed   Do not wear jewelry, make-up or nail polish.  Do not wear lotions, powders, or perfumes. You may wear deodorant.  Do not shave 48 hours prior to surgery. Men may shave face and neck.  Do not bring valuables to the hospital.  Contacts, dentures or bridgework may not be worn into surgery.  Leave suitcase in the car. After surgery it may be brought to your room.  For patients admitted to the hospital, checkout time is 11:00 AM the day of discharge.   Patients discharged the day of surgery will not be allowed to drive home.    Special Instructions: Shower using CHG night before surgery and shower the day of surgery use CHG.  Use special wash - you have one bottle of CHG for all showers.  You should use approximately 1/2 of the bottle for each shower. How to Use Chlorhexidine at Home in the Shower Chlorhexidine gluconate (CHG) is a germ-killing (antiseptic) wash that's used to clean the skin. It can get rid of the germs that normally live on the skin and can keep them away for about 24 hours. If you're having surgery, you may be told to shower with CHG at home the night before surgery. This can help lower your risk for infection. To use CHG wash in the shower, follow the steps below. Supplies needed: CHG body wash. Clean washcloth. Clean towel. How to use CHG in the shower Follow these steps unless you're told to use CHG in a different way: Start the shower. Use your normal soap and shampoo to wash your face and hair. Turn off the shower or move out of the shower stream. Pour CHG onto a clean washcloth. Do not use any type of brush or rough  sponge. Start at your neck, washing your body down to your toes. Make sure you: Wash the part of your body where the surgery will be done for at least 1 minute. Do not scrub. Do not use CHG on your head or face unless your health care provider tells you to. If it gets into your ears or eyes, rinse them well with water. Do not wash your genitals with CHG. Wash your back and under your arms. Make sure to wash skin folds. Let the CHG sit on your skin for 1-2 minutes or as long as told. Rinse your entire body in the shower, including all body creases and folds. Turn off the shower. Dry off with a clean towel. Do not put anything on your skin afterward, such as powder, lotion, or perfume. Put on clean clothes or pajamas. If it's the night before surgery, sleep in clean sheets. General tips Use CHG only as told, and follow the instructions on the label. Use the full amount of CHG as told. This is often one bottle. Do not smoke and stay away from flames after using CHG. Your skin may feel sticky after using CHG. This is normal. The sticky feeling will go away as the CHG dries. Do not use CHG: If you have a chlorhexidine allergy  or have reacted to chlorhexidine in the past. On open wounds or areas of skin that have broken skin, cuts, or scrapes. On babies younger than 25 months of age. Contact a health care provider if: You have questions about using CHG. Your skin gets irritated or itchy. You have a rash after using CHG. You swallow any CHG. Call your local poison control center [phone] in the [place] Your eyes itch badly, or they become very red or swollen. Your hearing changes. You have trouble seeing. If you can't reach your provider, go to an urgent care or emergency room. Do not drive yourself. Get help right away if: You have swelling or tingling in your mouth or throat. You make high-pitched whistling sounds when you breathe, most often when you breathe out (wheeze). You have  trouble breathing. These symptoms may be an emergency. Call 911 right away. Do not wait to see if the symptoms will go away. Do not drive yourself to the hospital. This information is not intended to replace advice given to you by your health care provider. Make sure you discuss any questions you have with your health care provider. Document Revised: 07/30/2022 Document Reviewed: 07/26/2021 Elsevier Patient Education  2024 [employer] Minimally Invasive Cholecystectomy, Care After The following information offers guidance on how to care for yourself after your procedure. Your health care provider may also give you more specific instructions. If you have problems or questions, contact your health care provider. What can I expect after the procedure? After the procedure, it is common to have: Pain at your incision sites. You will be given medicines to control this pain. Mild nausea or vomiting. Bloating and possible shoulder pain from the gas that was used during the procedure. Follow these instructions at home: Medicines Take over-the-counter and prescription medicines only as told by your health care provider. If you were prescribed an antibiotic medicine, take it as told by your health care provider. Do not stop using the antibiotic even if you start to feel better. Ask your health care provider if the medicine prescribed to you: Requires you to avoid driving or using machinery. Can cause constipation. You may need to take these actions to prevent or treat constipation: Drink enough fluid to keep your urine pale yellow. Take over-the-counter or prescription medicines. Eat foods that are high in fiber, such as beans, whole grains, and fresh fruits and vegetables. Limit foods that are high in fat and processed sugars, such as fried or sweet foods. Incision care  Follow instructions from your health care provider about how to take care of your incisions. Make sure you: Wash your hands  with soap and water for at least 20 seconds before and after you change your bandage (dressing). If soap and water are not available, use hand sanitizer. Change your dressing as told by your health care provider. Leave stitches (sutures), skin glue, or adhesive strips in place. These skin closures may need to be in place for 2 weeks or longer. If adhesive strip edges start to loosen and curl up, you may trim the loose edges. Do not remove adhesive strips completely unless your health care provider tells you to do that. Do not take baths, swim, or use a hot tub until your health care provider approves. Ask your health care provider if you may take showers. You may only be allowed to take sponge baths. Check your incision area every day for signs of infection. Check for: More redness, swelling, or pain. Fluid or blood.  Warmth. Pus or a bad smell. Activity Rest as told by your health care provider. Do not do activities that require a lot of effort. Avoid sitting for a long time without moving. Get up to take short walks every 1-2 hours. This is important to improve blood flow and breathing. Ask for help if you feel weak or unsteady. Do not lift anything that is heavier than 10 lb (4.5 kg), or the limit that you are told, until your health care provider says that it is safe. Do not play contact sports until your health care provider approves. Do not return to work or school until your health care provider approves. Return to your normal activities as told by your health care provider. Ask your health care provider what activities are safe for you. General instructions If you were given a sedative during the procedure, it can affect you for several hours. Do not drive or operate machinery until your health care provider says that it is safe. Keep all follow-up visits. This is important. Contact a health care provider if: You develop a rash. You have more redness, swelling, or pain around your  incisions. You have fluid or blood coming from your incisions. Your incisions feel warm to the touch. You have pus or a bad smell coming from your incisions. You have a fever. One or more of your incisions breaks open. Get help right away if: You have trouble breathing. You have chest pain. You have more pain in your shoulders. You faint or feel dizzy when you stand. You have severe pain in your abdomen. You have nausea or vomiting that lasts for more than one day. You have leg pain that is new or unusual, or if it is localized to one specific spot. These symptoms may represent a serious problem that is an emergency. Do not wait to see if the symptoms will go away. Get medical help right away. Call your local emergency services (911 in the [place] Do not drive yourself to the hospital. Summary After your procedure, it is common to have pain at the incision sites. You may also have nausea or bloating. Follow your health care provider's instructions about medicine, activity restrictions, and caring for your incision areas. Do not do activities that require a lot of effort. Contact a health care provider if you have a fever or other signs of infection, such as more redness, swelling, or pain around the incisions. Get help right away if you have chest pain, increasing pain in the shoulders, or trouble breathing. This information is not intended to replace advice given to you by your health care provider. Make sure you discuss any questions you have with your health care provider. Document Revised: 07/17/2020 Document Reviewed: 07/18/2020 Elsevier Patient Education  2024 [employer]. General Anesthesia, Adult, Care After The following information offers guidance on how to care for yourself after your procedure. Your health care provider may also give you more specific instructions. If you have problems or questions, contact your health care provider. What can I expect after the procedure? After  the procedure, it is common for people to: Have pain or discomfort at the IV site. Have nausea or vomiting. Have a sore throat or hoarseness. Have trouble concentrating. Feel cold or chills. Feel weak, sleepy, or tired (fatigue). Have soreness and body aches. These can affect parts of the body that were not involved in surgery. Follow these instructions at home: For the time period you were told by your health care provider:  Rest. Do not participate in activities where you could fall or become injured. Do not drive or use machinery. Do not drink alcohol. Do not take sleeping pills or medicines that cause drowsiness. Do not make important decisions or sign legal documents. Do not take care of children on your own. General instructions Drink enough fluid to keep your urine pale yellow. If you have sleep apnea, surgery and certain medicines can increase your risk for breathing problems. Follow instructions from your health care provider about wearing your sleep device: Anytime you are sleeping, including during daytime naps. While taking prescription pain medicines, sleeping medicines, or medicines that make you drowsy. Return to your normal activities as told by your health care provider. Ask your health care provider what activities are safe for you. Take over-the-counter and prescription medicines only as told by your health care provider. Do not use any products that contain nicotine or tobacco. These products include cigarettes, chewing tobacco, and vaping devices, such as e-cigarettes. These can delay incision healing after surgery. If you need help quitting, ask your health care provider. Contact a health care provider if: You have nausea or vomiting that does not get better with medicine. You vomit every time you eat or drink. You have pain that does not get better with medicine. You cannot urinate or have bloody urine. You develop a skin rash. You have a fever. Get help right  away if: You have trouble breathing. You have chest pain. You vomit blood. These symptoms may be an emergency. Get help right away. Call 911. Do not wait to see if the symptoms will go away. Do not drive yourself to the hospital. Summary After the procedure, it is common to have a sore throat, hoarseness, nausea, vomiting, or to feel weak, sleepy, or fatigue. For the time period you were told by your health care provider, do not drive or use machinery. Get help right away if you have difficulty breathing, have chest pain, or vomit blood. These symptoms may be an emergency. This information is not intended to replace advice given to you by your health care provider. Make sure you discuss any questions you have with your health care provider. Document Revised: 04/13/2021 Document Reviewed: 04/13/2021 Elsevier Patient Education  2024 [employer]. "

## 2024-02-27 ENCOUNTER — Encounter (HOSPITAL_COMMUNITY): Payer: Self-pay

## 2024-02-27 ENCOUNTER — Other Ambulatory Visit: Payer: Self-pay

## 2024-02-27 ENCOUNTER — Encounter (HOSPITAL_COMMUNITY)
Admission: RE | Admit: 2024-02-27 | Discharge: 2024-02-27 | Disposition: A | Source: Ambulatory Visit | Attending: Surgery

## 2024-02-27 VITALS — BP 112/73 | HR 89 | Resp 16 | Ht 62.0 in | Wt 215.0 lb

## 2024-02-27 DIAGNOSIS — Z01812 Encounter for preprocedural laboratory examination: Secondary | ICD-10-CM | POA: Insufficient documentation

## 2024-02-27 DIAGNOSIS — Z01818 Encounter for other preprocedural examination: Secondary | ICD-10-CM

## 2024-02-27 DIAGNOSIS — K8020 Calculus of gallbladder without cholecystitis without obstruction: Secondary | ICD-10-CM | POA: Diagnosis not present

## 2024-02-27 LAB — COMPREHENSIVE METABOLIC PANEL WITH GFR
ALT: 24 U/L (ref 0–44)
AST: 20 U/L (ref 15–41)
Albumin: 4.4 g/dL (ref 3.5–5.0)
Alkaline Phosphatase: 136 U/L — ABNORMAL HIGH (ref 38–126)
Anion gap: 13 (ref 5–15)
BUN: 10 mg/dL (ref 6–20)
CO2: 24 mmol/L (ref 22–32)
Calcium: 9.5 mg/dL (ref 8.9–10.3)
Chloride: 103 mmol/L (ref 98–111)
Creatinine, Ser: 0.62 mg/dL (ref 0.44–1.00)
GFR, Estimated: >60 mL/min
Glucose, Bld: 99 mg/dL (ref 70–99)
Potassium: 3.8 mmol/L (ref 3.5–5.1)
Sodium: 140 mmol/L (ref 135–145)
Total Bilirubin: 0.4 mg/dL (ref 0.0–1.2)
Total Protein: 7.7 g/dL (ref 6.5–8.1)

## 2024-02-27 LAB — PREGNANCY, URINE: Preg Test, Ur: NEGATIVE

## 2024-03-03 ENCOUNTER — Encounter (HOSPITAL_COMMUNITY): Payer: Self-pay | Admitting: Anesthesiology

## 2024-03-03 ENCOUNTER — Other Ambulatory Visit: Payer: Self-pay

## 2024-03-03 ENCOUNTER — Ambulatory Visit (HOSPITAL_COMMUNITY)
Admission: RE | Admit: 2024-03-03 | Discharge: 2024-03-03 | Disposition: A | Discharge status: Home / Self Care | Attending: Surgery | Admitting: Surgery

## 2024-03-03 ENCOUNTER — Encounter (HOSPITAL_COMMUNITY)
Admission: RE | Disposition: A | Discharge status: Home / Self Care | Payer: Self-pay | Source: Home / Self Care | Attending: Surgery

## 2024-03-03 ENCOUNTER — Encounter (HOSPITAL_COMMUNITY): Payer: Self-pay | Admitting: Surgery

## 2024-03-03 DIAGNOSIS — K8020 Calculus of gallbladder without cholecystitis without obstruction: Secondary | ICD-10-CM | POA: Diagnosis not present

## 2024-03-03 DIAGNOSIS — K8010 Calculus of gallbladder with chronic cholecystitis without obstruction: Secondary | ICD-10-CM | POA: Insufficient documentation

## 2024-03-03 MED ORDER — STERILE WATER FOR IRRIGATION IR SOLN
Status: DC | PRN
  Administered 2024-03-03: 1000 mL

## 2024-03-03 MED ORDER — BUPIVACAINE HCL (PF) 0.5 % IJ SOLN
INTRAMUSCULAR | Status: DC | PRN
  Administered 2024-03-03: 30 mL

## 2024-03-03 MED ORDER — SCOPOLAMINE 1 MG/3DAYS TD PT72
1.0000 | MEDICATED_PATCH | TRANSDERMAL | Status: DC
  Administered 2024-03-03: 1 mg via TRANSDERMAL

## 2024-03-03 MED ORDER — INDOCYANINE GREEN 25 MG IJ SOLR: 2.5000 mg | Freq: Once | INTRAMUSCULAR | Status: AC

## 2024-03-03 MED ORDER — HYDROMORPHONE HCL 1 MG/ML IJ SOLN
INTRAMUSCULAR | Status: AC
  Filled 2024-03-03: qty 0.5

## 2024-03-03 MED ORDER — DIPHENHYDRAMINE HCL 50 MG/ML IJ SOLN
INTRAMUSCULAR | Status: AC
  Filled 2024-03-03: qty 1

## 2024-03-03 MED ORDER — DEXAMETHASONE SOD PHOSPHATE PF 10 MG/ML IJ SOLN
INTRAMUSCULAR | Status: AC
  Filled 2024-03-03: qty 1

## 2024-03-03 MED ORDER — OXYCODONE HCL 5 MG PO TABS: 5.0000 mg | ORAL_TABLET | Freq: Four times a day (QID) | ORAL | 0 refills | Status: AC | PRN

## 2024-03-03 MED ORDER — SCOPOLAMINE 1 MG/3DAYS TD PT72
MEDICATED_PATCH | TRANSDERMAL | Status: AC
  Filled 2024-03-03: qty 1

## 2024-03-03 MED ORDER — DEXMEDETOMIDINE HCL IN NACL 80 MCG/20ML IV SOLN
INTRAVENOUS | Status: AC
  Filled 2024-03-03: qty 20

## 2024-03-03 MED ORDER — DIPHENHYDRAMINE HCL 50 MG/ML IJ SOLN
INTRAMUSCULAR | Status: DC | PRN
  Administered 2024-03-03: 12.5 mg via INTRAVENOUS

## 2024-03-03 MED ORDER — DOCUSATE SODIUM 100 MG PO CAPS: 100.0000 mg | ORAL_CAPSULE | Freq: Two times a day (BID) | ORAL | 2 refills | Status: AC

## 2024-03-03 MED ORDER — LACTATED RINGERS IV SOLN
INTRAVENOUS | Status: DC
  Administered 2024-03-03: 1000 mL via INTRAVENOUS

## 2024-03-03 MED ORDER — ACETAMINOPHEN 10 MG/ML IV SOLN
INTRAVENOUS | Status: AC
  Filled 2024-03-03: qty 100

## 2024-03-03 MED ORDER — OXYCODONE HCL 5 MG PO TABS: 5.0000 mg | ORAL_TABLET | Freq: Once | ORAL | Status: DC | PRN

## 2024-03-03 MED ORDER — FENTANYL CITRATE (PF) 100 MCG/2ML IJ SOLN
INTRAMUSCULAR | Status: DC | PRN
  Administered 2024-03-03: 100 ug via INTRAVENOUS
  Administered 2024-03-03 (×2): 50 ug via INTRAVENOUS

## 2024-03-03 MED ORDER — ROCURONIUM BROMIDE 10 MG/ML (PF) SYRINGE
PREFILLED_SYRINGE | INTRAVENOUS | Status: AC
  Filled 2024-03-03: qty 10

## 2024-03-03 MED ORDER — DEXAMETHASONE SOD PHOSPHATE PF 10 MG/ML IJ SOLN
INTRAMUSCULAR | Status: DC | PRN
  Administered 2024-03-03: 10 mg via INTRAVENOUS

## 2024-03-03 MED ORDER — LIDOCAINE 2% (20 MG/ML) 5 ML SYRINGE
INTRAMUSCULAR | Status: DC | PRN
  Administered 2024-03-03: 100 mg via INTRAVENOUS

## 2024-03-03 MED ORDER — PROPOFOL 500 MG/50ML IV EMUL
INTRAVENOUS | Status: DC | PRN
  Administered 2024-03-03: 150 ug/kg/min via INTRAVENOUS
  Administered 2024-03-03: 160 mg via INTRAVENOUS

## 2024-03-03 MED ORDER — CHLORHEXIDINE GLUCONATE 0.12 % MT SOLN
15.0000 mL | Freq: Once | OROMUCOSAL | Status: AC
  Administered 2024-03-03: 15 mL via OROMUCOSAL

## 2024-03-03 MED ORDER — ONDANSETRON HCL 4 MG/2ML IJ SOLN
4.0000 mg | Freq: Once | INTRAMUSCULAR | Status: AC | PRN
  Administered 2024-03-03: 8 mg via INTRAVENOUS

## 2024-03-03 MED ORDER — CHLORHEXIDINE GLUCONATE CLOTH 2 % EX PADS
6.0000 | MEDICATED_PAD | Freq: Once | CUTANEOUS | Status: AC
  Administered 2024-03-03: 6 via TOPICAL

## 2024-03-03 MED ORDER — SUGAMMADEX SODIUM 200 MG/2ML IV SOLN
INTRAVENOUS | Status: DC | PRN
  Administered 2024-03-03: 200 mg via INTRAVENOUS

## 2024-03-03 MED ORDER — ORAL CARE MOUTH RINSE: 15.0000 mL | Freq: Once | OROMUCOSAL | Status: AC

## 2024-03-03 MED ORDER — PROPOFOL 500 MG/50ML IV EMUL
INTRAVENOUS | Status: AC
  Filled 2024-03-03: qty 50

## 2024-03-03 MED ORDER — DEXMEDETOMIDINE HCL IN NACL 80 MCG/20ML IV SOLN
INTRAVENOUS | Status: DC | PRN
  Administered 2024-03-03: 8 ug via INTRAVENOUS

## 2024-03-03 MED ORDER — LIDOCAINE 2% (20 MG/ML) 5 ML SYRINGE
INTRAMUSCULAR | Status: AC
  Filled 2024-03-03: qty 5

## 2024-03-03 MED ORDER — ACETAMINOPHEN 10 MG/ML IV SOLN
INTRAVENOUS | Status: DC | PRN
  Administered 2024-03-03: 1000 mg via INTRAVENOUS

## 2024-03-03 MED ORDER — MIDAZOLAM HCL 2 MG/2ML IJ SOLN
INTRAMUSCULAR | Status: AC
  Filled 2024-03-03: qty 2

## 2024-03-03 MED ORDER — ROCURONIUM BROMIDE 10 MG/ML (PF) SYRINGE
PREFILLED_SYRINGE | INTRAVENOUS | Status: DC | PRN
  Administered 2024-03-03: 20 mg via INTRAVENOUS
  Administered 2024-03-03: 70 mg via INTRAVENOUS

## 2024-03-03 MED ORDER — HYDROMORPHONE HCL 1 MG/ML IJ SOLN
INTRAMUSCULAR | Status: DC | PRN
  Administered 2024-03-03: 1 mg via INTRAVENOUS

## 2024-03-03 MED ORDER — FENTANYL CITRATE (PF) 50 MCG/ML IJ SOSY: 25.0000 ug | PREFILLED_SYRINGE | INTRAMUSCULAR | Status: DC | PRN

## 2024-03-03 MED ORDER — FENTANYL CITRATE (PF) 100 MCG/2ML IJ SOLN
INTRAMUSCULAR | Status: AC
  Filled 2024-03-03: qty 2

## 2024-03-03 MED ORDER — MIDAZOLAM HCL (PF) 2 MG/2ML IJ SOLN
INTRAMUSCULAR | Status: DC | PRN
  Administered 2024-03-03: 2 mg via INTRAVENOUS

## 2024-03-03 MED ORDER — BUPIVACAINE HCL (PF) 0.5 % IJ SOLN
INTRAMUSCULAR | Status: AC
  Filled 2024-03-03: qty 30

## 2024-03-03 MED ORDER — OXYCODONE HCL 5 MG/5ML PO SOLN: 5.0000 mg | Freq: Once | ORAL | Status: DC | PRN

## 2024-03-03 MED ORDER — ACETAMINOPHEN 500 MG PO TABS: 1000.0000 mg | ORAL_TABLET | Freq: Four times a day (QID) | ORAL | 0 refills | Status: AC

## 2024-03-03 MED ORDER — SODIUM CHLORIDE 0.9 % IV SOLN
2.0000 g | INTRAVENOUS | Status: AC
  Administered 2024-03-03: 2 g via INTRAVENOUS
  Filled 2024-03-03: qty 2

## 2024-03-03 MED ORDER — ONDANSETRON HCL 4 MG PO TABS: 4.0000 mg | ORAL_TABLET | Freq: Three times a day (TID) | ORAL | 0 refills | Status: AC | PRN

## 2024-03-03 MED ORDER — ONDANSETRON HCL 4 MG/2ML IJ SOLN
INTRAMUSCULAR | Status: AC
  Filled 2024-03-03: qty 2

## 2024-03-03 MED ORDER — INDOCYANINE GREEN 25 MG IJ SOLR
INTRAMUSCULAR | Status: AC
  Administered 2024-03-03: 2.5 mg via INTRAVENOUS
  Filled 2024-03-03: qty 10

## 2024-03-03 NOTE — Op Note (Signed)
 [facility] Operative Note  03/03/24  Preoperative Diagnosis: Symptomatic Cholelithiasis   Postoperative Diagnosis: Same   Procedure(s) Performed: Robotic Assisted Laparoscopic Cholecystectomy   Surgeon: Dorothyann Brittle, DO   Assistants: No qualified resident was available    Anesthesia: General endotracheal   Anesthesiologist: Kendell Yvonna PARAS, MD    Specimens: Gallbladder   Estimated Blood Loss: Minimal   Blood Replacement: None    Complications: None   Wound Class: Clean contaminated   Operative Indications: The patient was found to have cholelithiasis on imaging and was symptomatic.  We discussed the risk of the procedure including but not limited to bleeding, infection, injury to the common bile duct, bile leak, need for further procedures, chance of subtotal cholecystectomy.   Findings:  Chronically inflamed gallbladder with dilated cystic duct Critical view of safety noted All clips intact at the end of the case Adequate hemostasis   Procedure: Firefly was given in the preoperative area.  The patient was taken to the operating room and placed supine. General endotracheal anesthesia was induced. Intravenous antibiotics were administered per protocol.  An orogastric tube positioned to decompress the stomach. The abdomen was prepared and draped in the usual sterile fashion. A time-out was completed verifying correct patient, procedure, site, positioning, and implant(s) and/or special equipment prior to beginning this procedure.  Veress needle was placed at the infraumbilical area and insufflation was started after confirming a positive saline drop test and no immediate increase in abdominal pressure.  After reaching 15 mm, the Veress needle was removed and a 8 mm port was placed via optiview technique infraumbilical, measuring 20 mm away from the suspected position of the gallbladder.  The abdomen was inspected and no abnormalities or injuries were  found.  Under direct vision, ports were placed in the following locations in a semi curvilinear position around the target of the gallbladder: Two 8 mm ports on the patient's right each having 8cm clearance to the adjacent ports and one 8 mm port placed on the patient's left 8 cm from the umbilical port. Once ports were placed, the table was placed in the reverse Trendelenburg position with the right side up. The Xi platform was brought into the operative field and docked to the ports successfully.  An endoscope was placed through the umbilical port, prograsp through the most lateral right port, fenestrated bipolar to the port just right of the umbilicus, and then a hook cautery in the left port.   The dome of the gallbladder was grasped with prograsp and retracted over the dome of the liver. Adhesions between the gallbladder and omentum, duodenum and transverse colon were lysed via hook cautery. The infundibulum was grasped with the fenestrated grasper and retracted toward the right lower quadrant. This maneuver exposed Calots triangle. Firefly was used throughout the dissection to ensure safe visualization of the cystic duct.  The peritoneum overlying the gallbladder infundibulum was then dissected and the cystic duct and cystic artery identified.  Critical view of safety with the liver bed clearly visible behind the duct and artery with no additional structures noted.  The cystic duct and cystic artery were doubly clipped and divided close to the gallbladder.    The gallbladder was then dissected from its peritoneal and liver bed attachments by electrocautery. Hemostasis was checked prior to removing the hook cautery.  The Anton was undocked and moved out of the field.  A 5mm Endo Catch bag was then placed through the umbilical port and the gallbladder was removed.  The  gallbladder was passed off the table as a specimen. There was no evidence of bleeding from the gallbladder fossa or cystic artery or leakage  of the bile from the cystic duct stump. The umbilical port site closed with a 0 vicryl with a PMI needle.  The abdomen was desufflated and secondary trocars were removed under direct vision. No bleeding was noted. Incisions were localized with marcaine .  All skin incisions were closed with subcuticular sutures of 4-0 monocryl and dermabond.   Final inspection revealed acceptable hemostasis. All counts were correct at the end of the case. The patient was awakened from anesthesia and extubated without complication. The OG tube was removed.  The patient went to the PACU in stable condition.   Dorothyann Brittle, DO [facility] [address] [place], [place] [address] [phone] (office)

## 2024-03-03 NOTE — Anesthesia Procedure Notes (Signed)
 Procedure Name: Intubation Date/Time: 03/03/2024 7:44 AM  Performed by: Para Jerelene CROME, CRNAPre-anesthesia Checklist: Patient identified, Emergency Drugs available, Patient being monitored and Suction available Patient Re-evaluated:Patient Re-evaluated prior to induction Oxygen Delivery Method: Circle system utilized Preoxygenation: Pre-oxygenation with 100% oxygen Induction Type: IV induction Ventilation: Mask ventilation without difficulty Laryngoscope Size: Mac and 4 Grade View: Grade II Tube type: Oral Tube size: 7.0 mm Number of attempts: 1 Airway Equipment and Method: Stylet Placement Confirmation: ETT inserted through vocal cords under direct vision, positive ETCO2, CO2 detector and breath sounds checked- equal and bilateral Secured at: 22 (OETT secured 22 cm at lower lip.) cm Tube secured with: Tape Dental Injury: Teeth and Oropharynx as per pre-operative assessment  Comments: Atraumatic intubation x 1. Lips and teeth remain in preoperative condition.

## 2024-03-03 NOTE — Progress Notes (Signed)
 [facility]  Spoke with the patient's father in the consultation room.  I explained that she tolerated the procedure without difficulty.  She has dissolvable stitches under the skin with overlying skin glue.  This will flake off in 10 to 14 days.  I discharged her home with a prescription for narcotic pain medication that they should take as needed for pain.  I also want her taking scheduled Tylenol .  If they take the narcotic pain medication, they should take a stool softener as well.  The patient will follow-up with me in 2 weeks for phone follow-up.  All questions were answered to his expressed satisfaction.  Dorothyann Brittle, DO [facility] [address] [place], [place] [address] [phone] (office)

## 2024-03-03 NOTE — Anesthesia Preprocedure Evaluation (Signed)
"                                    Anesthesia Evaluation  Patient identified by MRN, date of birth, ID band Patient awake    Reviewed: Allergy & Precautions, H&P , NPO status , Patient's Chart, lab work & pertinent test results, reviewed documented beta blocker date and time   History of Anesthesia Complications (+) PONV and history of anesthetic complications  Airway Mallampati: II  TM Distance: >3 FB Neck ROM: full    Dental no notable dental hx.    Pulmonary neg pulmonary ROS   Pulmonary exam normal breath sounds clear to auscultation       Cardiovascular Exercise Tolerance: Good hypertension,  Rhythm:regular Rate:Normal     Neuro/Psych negative neurological ROS  negative psych ROS   GI/Hepatic negative GI ROS, Neg liver ROS,,,  Endo/Other  diabetes  Class 4 obesity  Renal/GU negative Renal ROS  negative genitourinary   Musculoskeletal   Abdominal   Peds  Hematology negative hematology ROS (+)   Anesthesia Other Findings   Reproductive/Obstetrics negative OB ROS                              Anesthesia Physical Anesthesia Plan  ASA: 3  Anesthesia Plan: General and General ETT   Post-op Pain Management:    Induction:   PONV Risk Score and Plan: Ondansetron   Airway Management Planned:   Additional Equipment:   Intra-op Plan:   Post-operative Plan:   Informed Consent: I have reviewed the patients History and Physical, chart, labs and discussed the procedure including the risks, benefits and alternatives for the proposed anesthesia with the patient or authorized representative who has indicated his/her understanding and acceptance.     Dental Advisory Given  Plan Discussed with: CRNA  Anesthesia Plan Comments:         Anesthesia Quick Evaluation  "

## 2024-03-03 NOTE — H&P (Signed)
 [facility] History and Physical   Reason for Referral: Cholelithiasis Referring Physician: Rocco Hoh, FNP   Chief Complaint   New Patient (Initial Visit)        Wanda Craig is a 29 y.o. female.  HPI: Patient presents for evaluation of cholelithiasis.  She first began having issues with her gallbladder during her first pregnancy in 2022.  She then denied any issues until her subsequent pregnancy a couple of years later.  She then had a significant episode that resulted in presentation to the emergency department in November 2025.  At that time, she had severe right upper quadrant abdominal pain that radiated along the right side of her abdomen.  When she had had these episodes prior, the pain only lasted 25 to 30 minutes, however this lasted significantly longer.  While in the emergency department, she was noted to have significantly elevated LFTs and CBD was dilated at 7 mm on ultrasound, concerning for possible choledocholithiasis.  There was plan for admission for MRCP, however patient decided to follow-up outpatient with her primary care provider.  Following her ED evaluation, patient's pain resolved.  She underwent repeat blood work with her PCP, which demonstrated normalization of her total bilirubin, and AST.  Alkaline phosphatase improved to 229 and ALT improved to 71.  Prior to presentation to the emergency department, she had issues with pain 3-4 times a week for 3 weeks leading up to this visit.  She confirms nausea and vomiting with these episodes.  Since discharge from the emergency department, she has not had any further episodes.  She denies any significant past medical history and denies use of blood thinning medications.  She has never had any abdominal surgeries.  She denies use of tobacco products, alcohol, and illicit drugs.       Past Medical History:  Diagnosis Date   Gall stones     Gestational diabetes     Pregnancy induced hypertension              Past Surgical History:  Procedure Laterality Date   NO PAST SURGERIES               Family History  Problem Relation Age of Onset   Mental illness Paternal Grandmother     Schizophrenia Paternal Grandmother     Dementia Maternal Grandmother     Arthritis Mother     Breast cancer Maternal Aunt     Diabetes Paternal Uncle        [Social History]   [Social History]     Tobacco Use   Smoking status: Never   Smokeless tobacco: Never  Vaping Use   Vaping status: Never Used  Substance Use Topics   Alcohol use: Not Currently   Drug use: No      Medications: I have reviewed the patient's current medications. Allergies as of 02/10/2024         Reactions    Acetaminophen  Nausea And Vomiting            Medication List           Accurate as of February 10, 2024  3:15 PM. If you have any questions, ask your nurse or doctor.              STOP taking these medications     ondansetron  4 MG disintegrating tablet Commonly known as: ZOFRAN -ODT Stopped by: Dorothyann Gae Bihl, DO    Phexxi  1.8-1-0.4 % Gel Generic drug: Lactic Ac-Citric Ac-Pot Bitart Stopped by:  Mikaylah Libbey, DO    terbinafine  250 MG tablet Commonly known as: LamISIL  Stopped by: Dorothyann Flordia Kassem, DO    traMADol  50 MG tablet Commonly known as: ULTRAM  Stopped by: Dorothyann Jazminn Pomales, DO           TAKE these medications     cetirizine 10 MG tablet Commonly known as: ZYRTEC Take 10 mg by mouth daily.               ROS:  Constitutional: negative for chills, fatigue, and fevers Eyes: negative for visual disturbance and pain Ears, nose, mouth, throat, and face: negative for ear drainage, sore throat, and sinus problems Respiratory: negative for cough, wheezing, and shortness of breath Cardiovascular: negative for chest pain and palpitations Gastrointestinal: negative for abdominal pain, nausea, reflux symptoms, and vomiting Genitourinary:negative for dysuria and  frequency Integument/breast: negative for dryness and rash Hematologic/lymphatic: negative for bleeding and lymphadenopathy Musculoskeletal:negative for back pain and neck pain Neurological: negative for dizziness and tremors Endocrine: negative for temperature intolerance   Blood pressure 112/73, pulse 89, temperature 98.3 F (36.8 C), temperature source Oral, resp. rate 16, height 5' 2 (1.575 m), weight 215 lb (97.5 kg), last menstrual period 01/22/2024, SpO2 95%, not currently breastfeeding. Physical Exam Vitals reviewed.  Constitutional:      Appearance: Normal appearance.  HENT:     Head: Normocephalic and atraumatic.  Eyes:     Extraocular Movements: Extraocular movements intact.     Pupils: Pupils are equal, round, and reactive to light.  Cardiovascular:     Rate and Rhythm: Normal rate and regular rhythm.  Pulmonary:     Effort: Pulmonary effort is normal.     Breath sounds: Normal breath sounds.  Abdominal:     Comments: Abdomen soft, nondistended, no percussion tenderness, nontender to palpation; no rigidity, guarding, rebound tenderness; negative Murphy sign  Musculoskeletal:        General: Normal range of motion.     Cervical back: Normal range of motion.  Skin:    General: Skin is warm and dry.  Neurological:     General: No focal deficit present.     Mental Status: She is alert and oriented to person, place, and time.  Psychiatric:        Mood and Affect: Mood normal.        Behavior: Behavior normal.       Results: Abdominal US  (12/24/23): IMPRESSION: Cholelithiasis without sonographic evidence of acute cholecystitis.   Assessment & Plan:  Wanda Craig is a 29 y.o. female who presents for evaluation of cholelithiasis.   -We discussed the pathophysiology of gallbladder disease and the recommendations for cholecystectomy -I counseled the patient about the indication, risks and benefits of robotic assisted laparoscopic cholecystectomy.  She understands  there is a very small chance for bleeding, infection, injury to normal structures (including common bile duct), conversion to open surgery, persistent symptoms, evolution of postcholecystectomy diarrhea, need for secondary interventions, anesthesia reaction, cardiopulmonary issues and other risks not specifically detailed here. I described the expected recovery, the plan for follow-up and the restrictions during the recovery phase.  All questions were answered. -Patient tentatively scheduled for surgery on 2/4 -Given the concern for choledocholithiasis at the time of ED visit, I would like the patient to have a CMP performed at her preoperative appointment to verify that her LFTs have improved/remained normalized -Information provided to the patient regarding cholelithiasis, cholecystitis, cholecystectomy, and low fat diet -Advised to present to the ED if she begins to have  worsening abdominal pain, nausea, vomiting, fever, chills, and jaundiced skin   All questions were answered to the satisfaction of the patient.   Note: Portions of this report may have been transcribed using voice recognition software. Every effort has been made to ensure accuracy; however, inadvertent computerized transcription errors may still be present.    Dorothyann Brittle, DO [facility] [address] [place], [place] [address] [phone] (office)

## 2024-03-03 NOTE — Discharge Instructions (Signed)
 Ambulatory Surgery Discharge Instructions  General Anesthesia or Sedation Do not drive or operate heavy machinery for 24 hours.  Do not consume alcohol, tranquilizers, sleeping medications, or any non-prescribed medications for 24 hours. Do not make important decisions or sign any important papers in the next 24 hours. You should have someone with you tonight at home.  Activity  You are advised to go directly home from the hospital.  Restrict your activities and rest for a day.  Resume light activity tomorrow. No heavy lifting over 10 lbs or strenuous exercise.  Fluids and Diet Begin with clear liquids, bouillon, dry toast, soda crackers.  If not nauseated, you may go to a regular diet when you desire.  Greasy and spicy foods are not advised.  Medications  If you have not had a bowel movement in 24 hours, take 2 tablespoons over the counter Milk of mag.             You May resume your blood thinners tomorrow (Aspirin, coumadin, or other).  You are being discharged with prescriptions for Opioid/Narcotic Medications: There are some specific considerations for these medications that you should know. Opioid Meds have risks & benefits. Addiction to these meds is always a concern with prolonged use Take medication only as directed Do not drive while taking narcotic pain medication Do not crush tablets or capsules Do not use a different container than medication was dispensed in Lock the container of medication in a cool, dry place out of reach of children and pets. Opioid medication can cause addiction Do not share with anyone else (this is a felony) Do not store medications for future use. Dispose of them properly.     Disposal:  Find a [employer]  household drug take back site near you.  If you can't get to a drug take back site, use the recipe below as a last resort to dispose of expired, unused or unwanted drugs. Disposal  (Do not dispose chemotherapy drugs this way, talk to your  prescribing doctor instead.) Step 1: Mix drugs (do not crush) with dirt, kitty litter, or used coffee grounds and add a small amount of water  to dissolve any solid medications. Step 2: Seal drugs in plastic bag. Step 3: Place plastic bag in trash. Step 4: Take prescription container and scratch out personal information, then recycle or throw away.  Operative Site  You have a liquid bandage over your incisions, this will begin to flake off in about a week. Ok to English as a second language teacher. Keep wound clean and dry. No baths or swimming. No lifting more than 10 pounds.  Contact Information: If you have questions or concerns, please call our office, [phone], Monday- Thursday 8AM-5PM and Friday 8AM-12Noon.  If it is after hours or on the weekend, please call [facility], [phone], and ask to speak to the surgeon on call for Dr. Evonnie at [facility].   SPECIFIC COMPLICATIONS TO WATCH FOR: Inability to urinate Fever over 101? F by mouth Nausea and vomiting lasting longer than 24 hours. Pain not relieved by medication ordered Swelling around the operative site Increased redness, warmth, hardness, around operative area Numbness, tingling, or cold fingers or toes Blood -soaked dressing, (small amounts of oozing may be normal) Increasing and progressive drainage from surgical area or exam site

## 2024-03-03 NOTE — Transfer of Care (Signed)
 Immediate Anesthesia Transfer of Care Note  Patient: Wanda Craig  Procedure(s) Performed: CHOLECYSTECTOMY, ROBOT-ASSISTED, LAPAROSCOPIC (Abdomen)  Patient Location: PACU  Anesthesia Type:General  Level of Consciousness: drowsy and patient cooperative  Airway & Oxygen Therapy: Patient Spontanous Breathing and Patient connected to nasal cannula oxygen  Post-op Assessment: Report given to RN and Post -op Vital signs reviewed and stable  Post vital signs: Reviewed and stable  Last Vitals:  Vitals Value Taken Time  BP 122/77 03/03/24 09:26  Temp 97.8 03/03/24   09:26  Pulse 76 03/03/24 09:27  Resp 16 03/03/24 09:29  SpO2 100 % 03/03/24 09:27  Vitals shown include unfiled device data.  Last Pain:  Vitals:   03/03/24 0649  TempSrc: Oral  PainSc: 0-No pain      Patients Stated Pain Goal: 9 (03/03/24 9350)  Complications: No notable events documented.

## 2024-03-04 LAB — SURGICAL PATHOLOGY

## 2024-03-04 NOTE — Anesthesia Postprocedure Evaluation (Signed)
"   Anesthesia Post Note  Patient: Wanda Craig  Procedure(s) Performed: CHOLECYSTECTOMY, ROBOT-ASSISTED, LAPAROSCOPIC (Abdomen)  Patient location during evaluation: Phase II Anesthesia Type: General Level of consciousness: awake Pain management: pain level controlled Vital Signs Assessment: post-procedure vital signs reviewed and stable Respiratory status: spontaneous breathing and respiratory function stable Cardiovascular status: blood pressure returned to baseline and stable Postop Assessment: no headache and no apparent nausea or vomiting Anesthetic complications: no Comments: Late entry   No notable events documented.   Last Vitals:  Vitals:   03/03/24 1031 03/03/24 1050  BP: 114/77 131/78  Pulse: 74 68  Resp: 13 16  Temp: 36.5 C 36.5 C  SpO2: 99% 100%    Last Pain:  Vitals:   03/03/24 1050  TempSrc: Oral  PainSc: 6                  Yvonna PARAS Daylynn Stumpp      "

## 2024-03-16 ENCOUNTER — Encounter: Admitting: Surgery
